# Patient Record
Sex: Female | Born: 2000 | Race: White | Hispanic: No | Marital: Single | Smoking: Never smoker
Health system: Southern US, Community
[De-identification: ages and names within clinical notes are randomized; demographics above are authoritative.]

## PROBLEM LIST (undated history)

## (undated) DIAGNOSIS — R51 Headache: Secondary | ICD-10-CM

## (undated) DIAGNOSIS — Z973 Presence of spectacles and contact lenses: Secondary | ICD-10-CM

## (undated) DIAGNOSIS — Z8489 Family history of other specified conditions: Secondary | ICD-10-CM

## (undated) DIAGNOSIS — R519 Headache, unspecified: Secondary | ICD-10-CM

## (undated) DIAGNOSIS — T8859XA Other complications of anesthesia, initial encounter: Secondary | ICD-10-CM

## (undated) HISTORY — PX: OTHER SURGICAL HISTORY: SHX169

---
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
## 2019-01-04 ENCOUNTER — Encounter: Payer: Self-pay | Admitting: Plastic Surgery

## 2019-01-04 ENCOUNTER — Other Ambulatory Visit: Payer: Self-pay

## 2019-01-04 ENCOUNTER — Ambulatory Visit (INDEPENDENT_AMBULATORY_CARE_PROVIDER_SITE_OTHER): Payer: Self-pay | Admitting: Plastic Surgery

## 2019-01-04 DIAGNOSIS — L910 Hypertrophic scar: Secondary | ICD-10-CM | POA: Insufficient documentation
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
## 2019-02-01 ENCOUNTER — Ambulatory Visit (INDEPENDENT_AMBULATORY_CARE_PROVIDER_SITE_OTHER): Payer: BC Managed Care – PPO | Admitting: Plastic Surgery

## 2019-02-01 ENCOUNTER — Encounter: Payer: Self-pay | Admitting: Plastic Surgery

## 2019-02-01 ENCOUNTER — Other Ambulatory Visit: Payer: Self-pay

## 2019-02-01 VITALS — BP 118/75 | HR 83 | Temp 98.2°F | Ht 65.0 in | Wt 116.0 lb

## 2019-02-01 DIAGNOSIS — L910 Hypertrophic scar: Secondary | ICD-10-CM | POA: Diagnosis not present

## 2019-02-01 NOTE — Progress Notes (Signed)
Procedure Note  Preoperative Dx: hypertrophic scar of right wrist  Postoperative Dx: Same  Procedure: Kenalog injection to right wrist scar 5 cm    Description of Procedure: Risks and complications were explained to the patient.  Consent was confirmed and the patient understands the risks and benefits.  The potential complications and alternatives were explained and the patient consents.  The patient expressed understanding the option of not having the procedure and the risks of a scar.  Time out was called and all information was confirmed to be correct.    The area was prepped with chlorhexidine.  Lidocaine 1% 0.1 cc and Kenalog 50 / 5 mg 0.1 mixture was prepared.  The scar was injected with the mixture.  A dressing was applied.  The patient was given instructions on how to care for the area and a follow up appointment.  Linda Monroe tolerated the procedure well and there were no complications.

## 2019-03-11 ENCOUNTER — Ambulatory Visit (INDEPENDENT_AMBULATORY_CARE_PROVIDER_SITE_OTHER): Payer: BC Managed Care – PPO | Admitting: Plastic Surgery

## 2019-03-11 ENCOUNTER — Other Ambulatory Visit: Payer: Self-pay

## 2019-03-11 ENCOUNTER — Encounter: Payer: Self-pay | Admitting: Plastic Surgery

## 2019-03-11 VITALS — BP 111/72 | HR 82 | Temp 97.8°F | Ht 65.0 in | Wt 120.0 lb

## 2019-03-11 DIAGNOSIS — L910 Hypertrophic scar: Secondary | ICD-10-CM
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
## 2019-03-15 ENCOUNTER — Ambulatory Visit: Payer: BC Managed Care – PPO | Admitting: Plastic Surgery

## 2019-05-24 ENCOUNTER — Ambulatory Visit: Payer: BC Managed Care – PPO | Admitting: Plastic Surgery

## 2019-06-28 ENCOUNTER — Ambulatory Visit (INDEPENDENT_AMBULATORY_CARE_PROVIDER_SITE_OTHER): Payer: BC Managed Care – PPO | Admitting: Plastic Surgery

## 2019-06-28 ENCOUNTER — Encounter: Payer: Self-pay | Admitting: Plastic Surgery

## 2019-06-28 ENCOUNTER — Other Ambulatory Visit: Payer: Self-pay

## 2019-06-28 VITALS — BP 110/67 | HR 80 | Temp 97.8°F | Ht 65.0 in | Wt 119.2 lb

## 2019-06-28 DIAGNOSIS — L910 Hypertrophic scar: Secondary | ICD-10-CM | POA: Diagnosis not present

## 2019-06-28 DIAGNOSIS — Z719 Counseling, unspecified: Secondary | ICD-10-CM

## 2019-06-28 NOTE — Progress Notes (Signed)
Procedure Note  Preoperative Dx: Hypertrophic scar right wrist  Postoperative Dx: Same  Procedure: Kenalog injection hypertrophic scar right wrist 5 cm  Description of Procedure: Risks and complications were explained to the patient and mom.  Consent was confirmed and the patient understands the risks and benefits.  The potential complications and alternatives were explained and the patient consents.  The patient expressed understanding the option of not having the procedure and the risks of a scar.  Time out was called and all information was confirmed to be correct.    The area was prepped.  Lidocaine 1% with epinepherine 0.2 cc and kenalog 10 mg/1 ml 0.2 cc was injected into the hypertrophic scar of the right wrist.   A dressing was applied.  The patient was given instructions on how to care for the area and a follow up appointment.  Linda Monroe tolerated the procedure well and there were no complications.  I also recommended skinUva.

## 2019-09-30 ENCOUNTER — Other Ambulatory Visit: Payer: Self-pay

## 2019-09-30 ENCOUNTER — Ambulatory Visit (INDEPENDENT_AMBULATORY_CARE_PROVIDER_SITE_OTHER): Payer: BC Managed Care – PPO | Admitting: Plastic Surgery

## 2019-09-30 ENCOUNTER — Encounter: Payer: Self-pay | Admitting: Plastic Surgery

## 2019-09-30 VITALS — BP 121/75 | HR 86 | Temp 98.5°F

## 2019-09-30 DIAGNOSIS — L910 Hypertrophic scar: Secondary | ICD-10-CM | POA: Diagnosis not present

## 2019-09-30 NOTE — Progress Notes (Signed)
   Subjective:    Patient ID: Linda Monroe, female    DOB: 01/21/2001, 19 y.o.   MRN: [id number]  The patient is a 19 year old female here with mom for evaluation of her right wrist hypertrophic/keloid scar.  Based on what we see today it looks like it may be a little bit better.  She has been using the skinuva cream and doing a little bit of massage.  The Kenalog injection was in May.   Review of Systems  Constitutional: Negative.   Eyes: Negative.   Respiratory: Negative.   Cardiovascular: Negative.   Genitourinary: Negative.   Musculoskeletal: Negative.        Objective:   Physical Exam Vitals and nursing note reviewed.  Constitutional:      Appearance: Normal appearance.  HENT:     Head: Normocephalic and atraumatic.  Cardiovascular:     Rate and Rhythm: Normal rate.     Pulses: Normal pulses.  Neurological:     General: No focal deficit present.     Mental Status: She is alert and oriented to person, place, and time.  Psychiatric:        Mood and Affect: Mood normal.        Behavior: Behavior normal.        Thought Content: Thought content normal.        Assessment & Plan:     ICD-10-CM   1. Hypertrophic scar of skin  L91.0   We agree that giving it more time would be wise.  We do not want atrophy of the area.  The injection or even the skin [place] Artist seems to be helping.  We will give it 2-3 more months and see her back.  In the meantime recommend continuing with the skinuva and massage.  Pictures were obtained of the patient and placed in the chart with the patient's or guardian's permission.

## 2019-12-15 ENCOUNTER — Encounter: Payer: Self-pay | Admitting: Nurse Practitioner

## 2019-12-15 ENCOUNTER — Other Ambulatory Visit: Payer: Self-pay

## 2019-12-15 ENCOUNTER — Ambulatory Visit (INDEPENDENT_AMBULATORY_CARE_PROVIDER_SITE_OTHER): Payer: Managed Care, Other (non HMO) | Admitting: Nurse Practitioner

## 2019-12-15 VITALS — BP 118/78 | Ht 65.0 in | Wt 118.0 lb

## 2019-12-15 DIAGNOSIS — Z01419 Encounter for gynecological examination (general) (routine) without abnormal findings: Secondary | ICD-10-CM

## 2019-12-15 DIAGNOSIS — Z113 Encounter for screening for infections with a predominantly sexual mode of transmission: Secondary | ICD-10-CM | POA: Diagnosis not present

## 2019-12-15 DIAGNOSIS — Z3009 Encounter for other general counseling and advice on contraception: Secondary | ICD-10-CM | POA: Diagnosis not present

## 2019-12-15 NOTE — Patient Instructions (Signed)
Health Maintenance, Female Adopting a healthy lifestyle and getting preventive care are important in promoting health and wellness. Ask your health care provider about:  The right schedule for you to have regular tests and exams.  Things you can do on your own to prevent diseases and keep yourself healthy. What should I know about diet, weight, and exercise? Eat a healthy diet   Eat a diet that includes plenty of vegetables, fruits, low-fat dairy products, and lean protein.  Do not eat a lot of foods that are high in solid fats, added sugars, or sodium. Maintain a healthy weight Body mass index (BMI) is used to identify weight problems. It estimates body fat based on height and weight. Your health care provider can help determine your BMI and help you achieve or maintain a healthy weight. Get regular exercise Get regular exercise. This is one of the most important things you can do for your health. Most adults should:  Exercise for at least 150 minutes each week. The exercise should increase your heart rate and make you sweat (moderate-intensity exercise).  Do strengthening exercises at least twice a week. This is in addition to the moderate-intensity exercise.  Spend less time sitting. Even light physical activity can be beneficial. Watch cholesterol and blood lipids Have your blood tested for lipids and cholesterol at 20 years of age, then have this test every 5 years. Have your cholesterol levels checked more often if:  Your lipid or cholesterol levels are high.  You are older than 20 years of age.  You are at high risk for heart disease. What should I know about cancer screening? Depending on your health history and family history, you may need to have cancer screening at various ages. This may include screening for:  Breast cancer.  Cervical cancer.  Colorectal cancer.  Skin cancer.  Lung cancer. What should I know about heart disease, diabetes, and high blood  pressure? Blood pressure and heart disease  High blood pressure causes heart disease and increases the risk of stroke. This is more likely to develop in people who have high blood pressure readings, are of African descent, or are overweight.  Have your blood pressure checked: ? Every 3-5 years if you are 18-39 years of age. ? Every year if you are 40 years old or older. Diabetes Have regular diabetes screenings. This checks your fasting blood sugar level. Have the screening done:  Once every three years after age 40 if you are at a normal weight and have a low risk for diabetes.  More often and at a younger age if you are overweight or have a high risk for diabetes. What should I know about preventing infection? Hepatitis B If you have a higher risk for hepatitis B, you should be screened for this virus. Talk with your health care provider to find out if you are at risk for hepatitis B infection. Hepatitis C Testing is recommended for:  Everyone born from 1945 through 1965.  Anyone with known risk factors for hepatitis C. Sexually transmitted infections (STIs)  Get screened for STIs, including gonorrhea and chlamydia, if: ? You are sexually active and are younger than 20 years of age. ? You are older than 20 years of age and your health care provider tells you that you are at risk for this type of infection. ? Your sexual activity has changed since you were last screened, and you are at increased risk for chlamydia or gonorrhea. Ask your health care provider if   you are at risk.  Ask your health care provider about whether you are at high risk for HIV. Your health care provider may recommend a prescription medicine to help prevent HIV infection. If you choose to take medicine to prevent HIV, you should first get tested for HIV. You should then be tested every 3 months for as long as you are taking the medicine. Pregnancy  If you are about to stop having your period (premenopausal) and  you may become pregnant, seek counseling before you get pregnant.  Take 400 to 800 micrograms (mcg) of folic acid every day if you become pregnant.  Ask for birth control (contraception) if you want to prevent pregnancy. Osteoporosis and menopause Osteoporosis is a disease in which the bones lose minerals and strength with aging. This can result in bone fractures. If you are 65 years old or older, or if you are at risk for osteoporosis and fractures, ask your health care provider if you should:  Be screened for bone loss.  Take a calcium or vitamin D supplement to lower your risk of fractures.  Be given hormone replacement therapy (HRT) to treat symptoms of menopause. Follow these instructions at home: Lifestyle  Do not use any products that contain nicotine or tobacco, such as cigarettes, e-cigarettes, and chewing tobacco. If you need help quitting, ask your health care provider.  Do not use street drugs.  Do not share needles.  Ask your health care provider for help if you need support or information about quitting drugs. Alcohol use  Do not drink alcohol if: ? Your health care provider tells you not to drink. ? You are pregnant, may be pregnant, or are planning to become pregnant.  If you drink alcohol: ? Limit how much you use to 0-1 drink a day. ? Limit intake if you are breastfeeding.  Be aware of how much alcohol is in your drink. In the [place] one drink equals one 12 oz bottle of beer (355 mL), one 5 oz glass of wine (148 mL), or one 1 oz glass of hard liquor (44 mL). General instructions  Schedule regular health, dental, and eye exams.  Stay current with your vaccines.  Tell your health care provider if: ? You often feel depressed. ? You have ever been abused or do not feel safe at home. Summary  Adopting a healthy lifestyle and getting preventive care are important in promoting health and wellness.  Follow your health care provider's instructions about healthy  diet, exercising, and getting tested or screened for diseases.  Follow your health care provider's instructions on monitoring your cholesterol and blood pressure. This information is not intended to replace advice given to you by your health care provider. Make sure you discuss any questions you have with your health care provider. Document Revised: 02/03/2018 Document Reviewed: 02/03/2018 Elsevier Patient Education  2020 [employer]  

## 2019-12-15 NOTE — Progress Notes (Signed)
   DEJAH DROESSLER 2000-08-07 [id number]   History:  19 y.o. G0 presents to establish care without GYN complaints. Regular monthly cycle. Condoms for contraception. Sexually active, multiple partners. Gardasil series completed.   Gynecologic History Patient's last menstrual period was 12/08/2019. Period Cycle (Days): 28 Period Duration (Days): 5 Period Pattern: Regular Menstrual Flow: Moderate Menstrual Control: Maxi pad, Tampon Dysmenorrhea: (!) Mild Dysmenorrhea Symptoms: Cramping Contraception: condoms   Past medical history, past surgical history, family history and social history were all reviewed and documented in the EPIC chart.  ROS:  A ROS was performed and pertinent positives and negatives are included.  Exam:  Vitals:   12/15/19 1157  BP: 118/78  Weight: 118 lb (53.5 kg)  Height: 5\' 5"  (1.651 m)   Body mass index is 19.64 kg/m.  General appearance:  Normal Thyroid:  Symmetrical, normal in size, without palpable masses or nodularity. Respiratory  Auscultation:  Clear without wheezing or rhonchi Cardiovascular  Auscultation:  Regular rate, without rubs, murmurs or gallops  Edema/varicosities:  Not grossly evident Abdominal  Soft,nontender, without masses, guarding or rebound.  Liver/spleen:  No organomegaly noted  Hernia:  None appreciated  Skin  Inspection:  Grossly normal   Breasts: Examined lying and sitting.   Right: Without masses, retractions, discharge or axillary adenopathy.   Left: Without masses, retractions, discharge or axillary adenopathy. Gentitourinary   Inguinal/mons:  Normal without inguinal adenopathy  External genitalia:  Normal  BUS/Urethra/Skene's glands:  Normal  Vagina:  Moderate discharge, no erythema  Cervix:  Normal  Uterus:  Normal in size, shape and contour.  Midline and mobile  Adnexa/parametria:     Rt: Without masses or tenderness.   Lt: Without masses or tenderness.  Anus and perineum: Normal  Assessment/Plan:  19  y.o. G0 to establish care.   Well female exam with routine gynecological exam - Education provided on SBEs, importance of preventative screenings, current guidelines, high calcium diet, regular exercise, and multivitamin daily.   Screen for STD (sexually transmitted disease) - Plan: C. trachomatis/N. gonorrhoeae RNA, HIV Antibody (routine testing w rflx), RPR.   Encounter for other general counseling and advice on contraception - Discussed contraception options to include pill, patch, vaginal ring, injectable, implant, and IUD. She is not interested at this time. Recommend condom use consistently until permanent partner. She does complain of headaches during cycle so I informed her that hormonal contraception may help with this.  Follow up in 1 year for annual.        [facility], 12:06 PM 12/15/2019

## 2019-12-16 ENCOUNTER — Other Ambulatory Visit: Payer: Self-pay

## 2019-12-16 ENCOUNTER — Encounter: Payer: Self-pay | Admitting: Plastic Surgery

## 2019-12-16 ENCOUNTER — Ambulatory Visit (INDEPENDENT_AMBULATORY_CARE_PROVIDER_SITE_OTHER): Payer: Managed Care, Other (non HMO) | Admitting: Plastic Surgery

## 2019-12-16 VITALS — BP 115/73 | HR 77 | Temp 98.4°F

## 2019-12-16 DIAGNOSIS — L910 Hypertrophic scar: Secondary | ICD-10-CM

## 2019-12-16 LAB — HIV ANTIBODY (ROUTINE TESTING W REFLEX): HIV 1&2 Ab, 4th Generation: NONREACTIVE

## 2019-12-16 LAB — C. TRACHOMATIS/N. GONORRHOEAE RNA
C. trachomatis RNA, TMA: NOT DETECTED
N. gonorrhoeae RNA, TMA: NOT DETECTED

## 2019-12-16 LAB — RPR: RPR Ser Ql: NONREACTIVE

## 2019-12-16 NOTE — Progress Notes (Signed)
Patient is a 19 year old female here for follow-up on her right breast hypertrophic scar/keloid.  She was using the skin Uva for about a month but not twice a day.  We talked about the options for another steroid injection, excision versus more conservative treatment.  What we decided was to be really good about using the skin Uva twice a day for the next 2 months.  Use the silicone patches as much as possible and massage.  She can come and see me before she does her semester abroad in [place].  If needed we can do a steroid injection at that time.  Were just trying to prevent thinning of the skin.

## 2020-01-25 IMAGING — RF DG WRIST COMPLETE 3+V*R*
1 series · 5 of 5 positions shown · non-contrast
Comparison: May 04, 2017

FLUOROSCOPY TIME:  0 minutes 16 seconds; 5 acquired images

CLINICAL DATA: Open reduction internal fixation for fracture

EXAM:
RIGHT WRIST - COMPLETE 3+ VIEW

[Series 1: run · 5 of 5 slices shown]
[im 1/5]
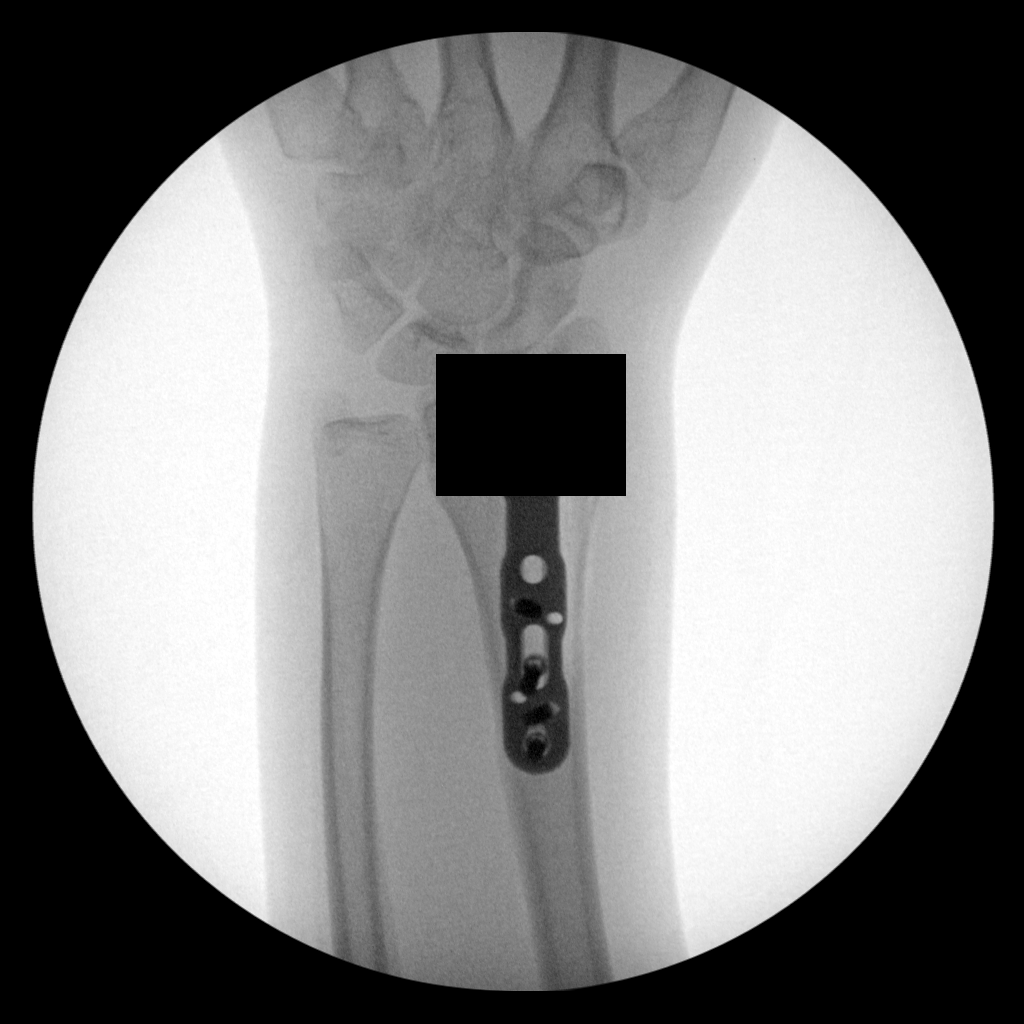
[im 2/5]
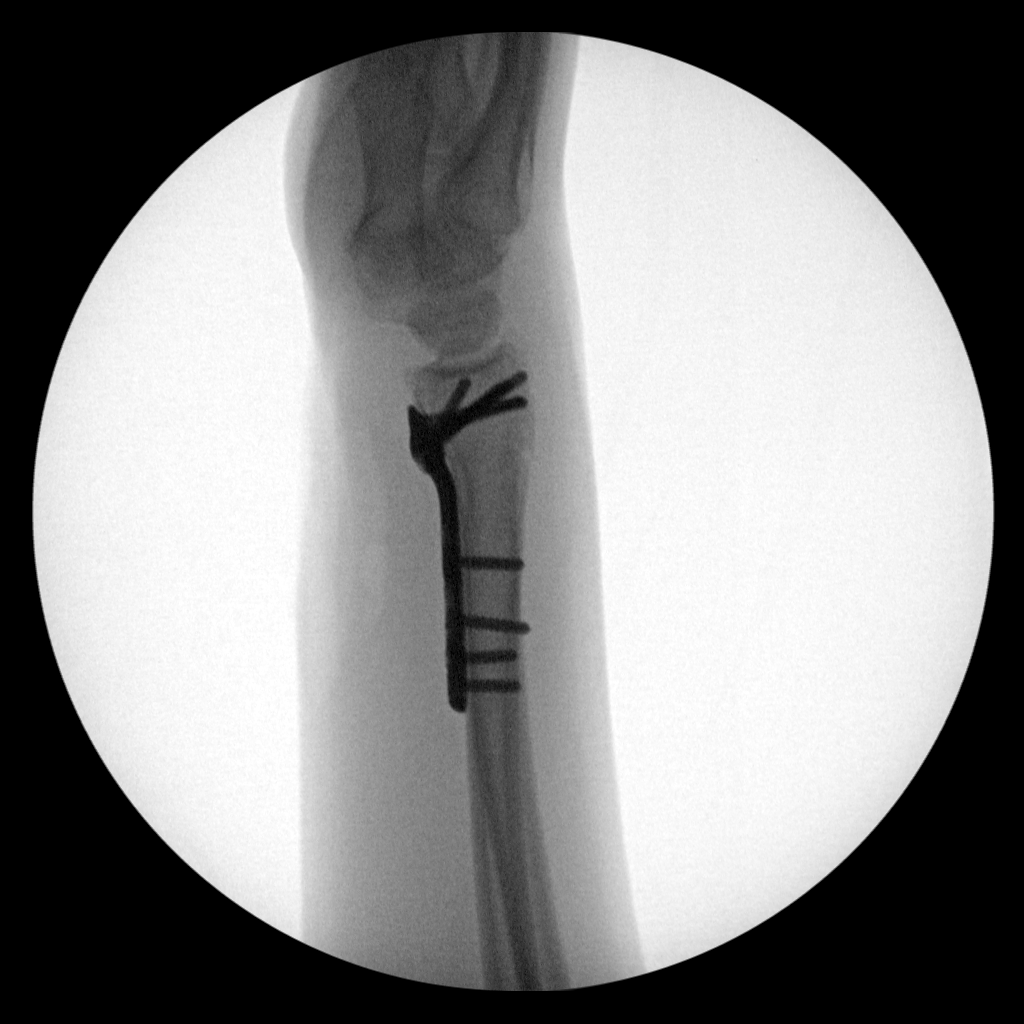
[im 3/5]
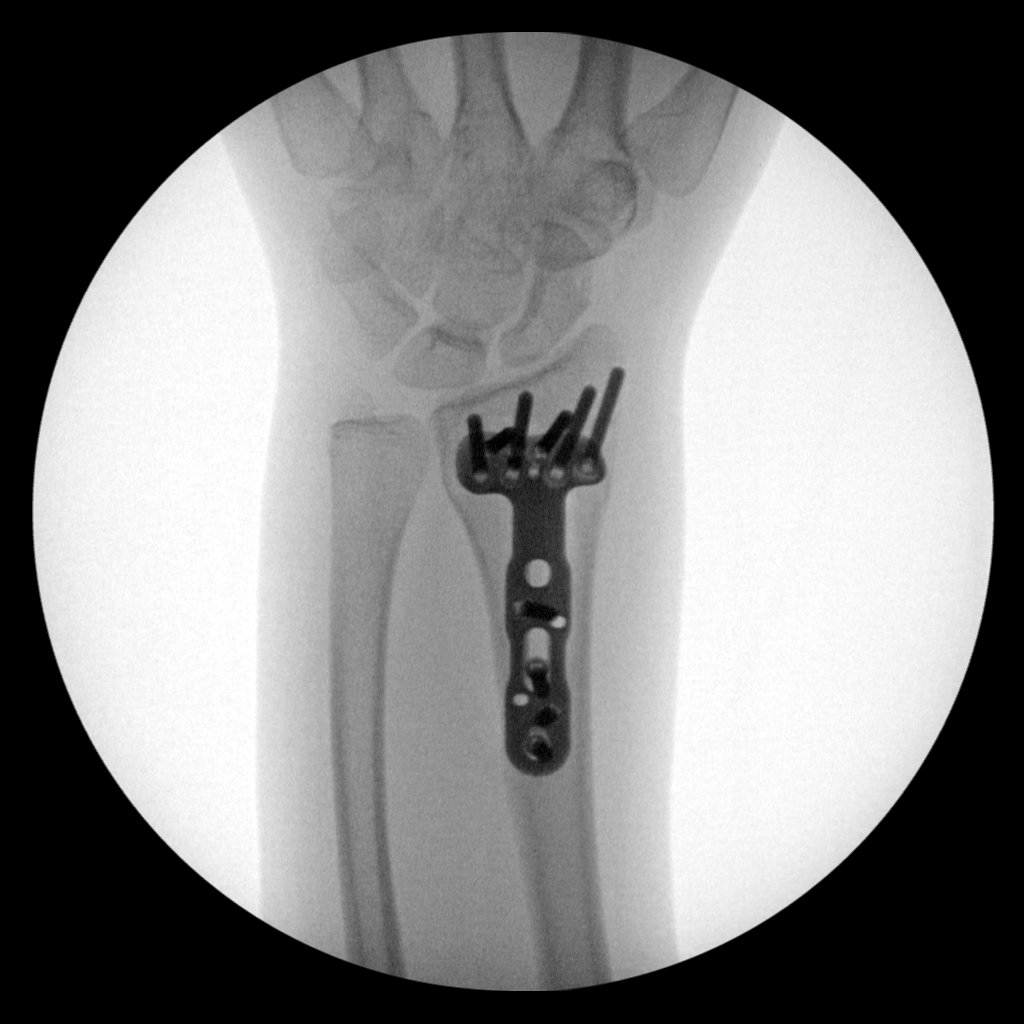
[im 4/5]
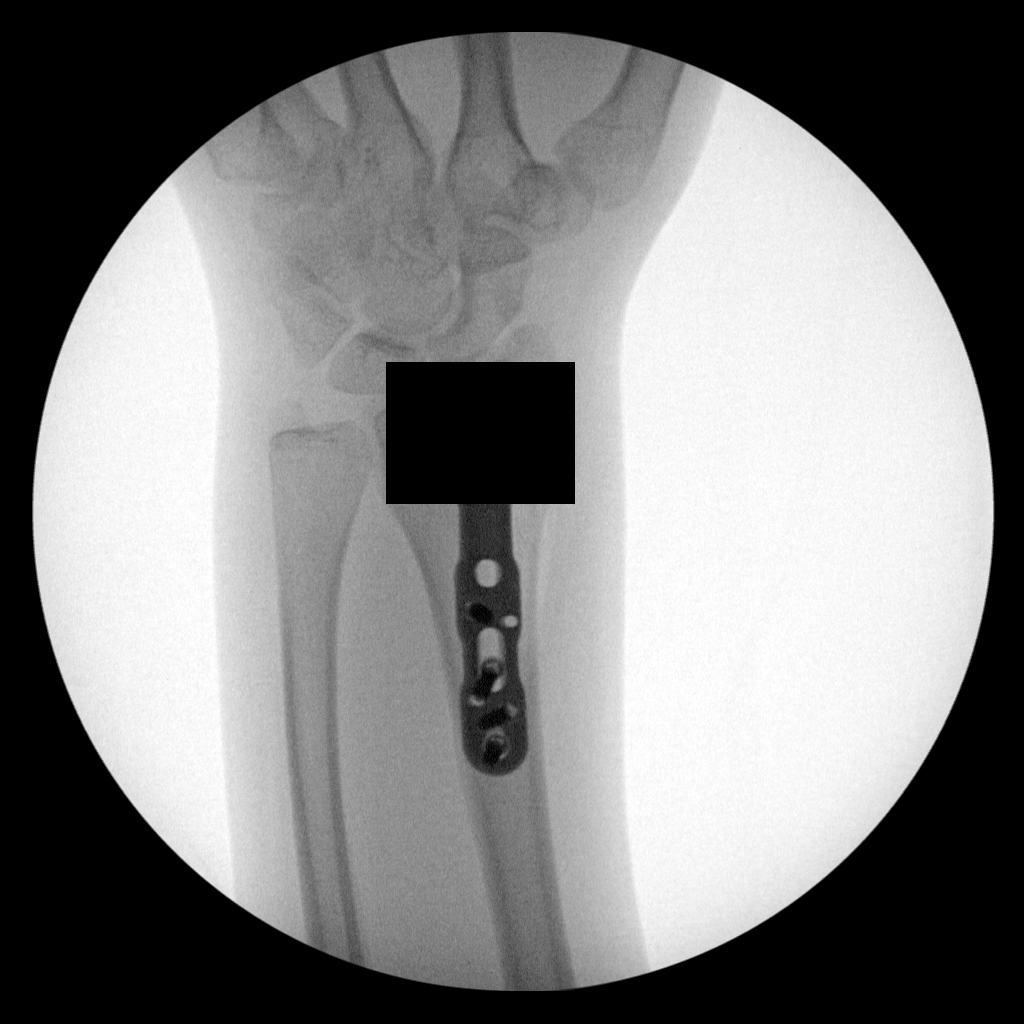
[im 5/5]
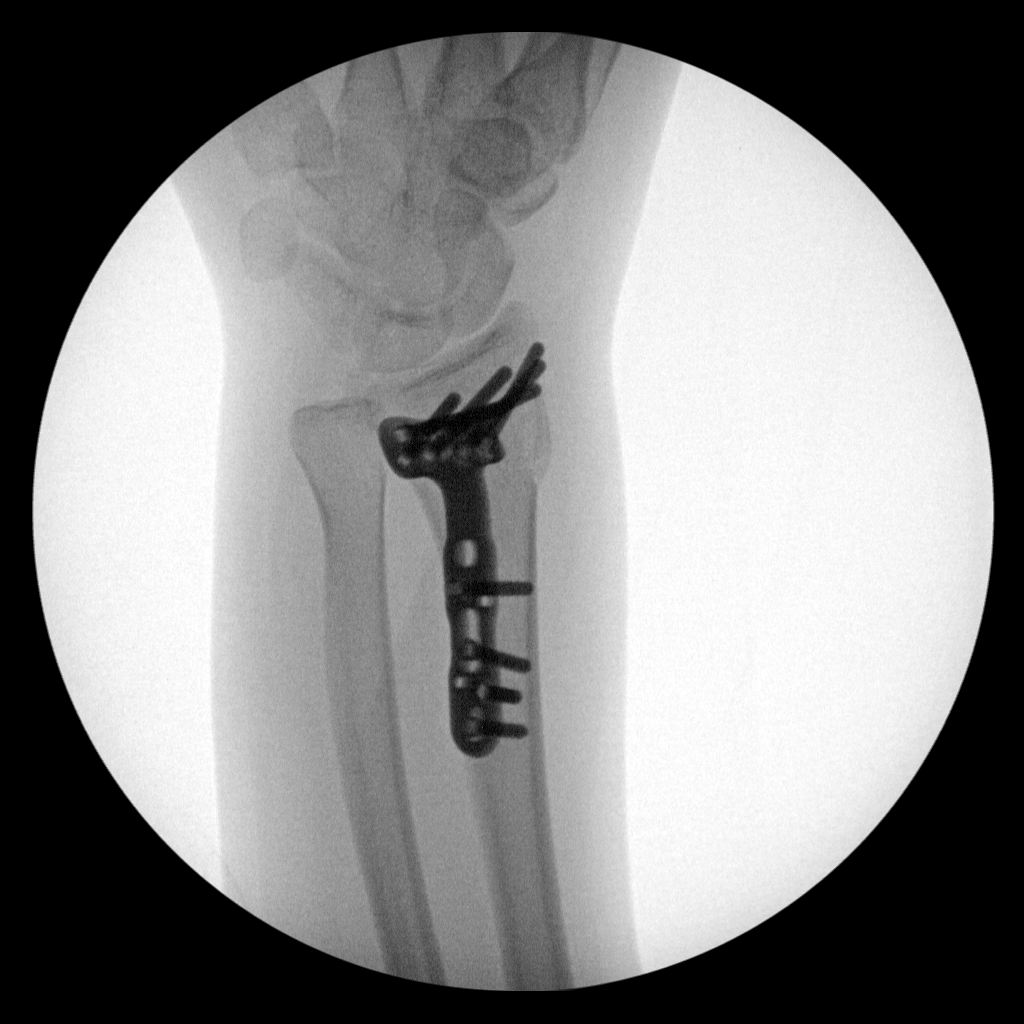

[5 of 5 positions shown; findings below may reference images not displayed]

FINDINGS: Frontal, oblique, and lateral views obtained. There is screw and
plate fixation through a fracture of the distal radial metaphysis
with alignment essentially anatomic. No new fracture. No
dislocation. Joint spaces appear normal. No erosive change.
IMPRESSION: Open reduction internal fixation for distal radial fracture with
alignment anatomic following screw and plate fixation. No new
fracture. No dislocation. No evident arthropathic change.
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
## 2020-01-25 IMAGING — RF DG C-ARM 61-120 MIN
1 series · 10 of 10 positions shown · non-contrast
Comparison: Radiographs May 04, 2017.

CLINICAL DATA: Left sacroiliac screw placement for fracture.

EXAM:
BILATERAL SACROILIAC JOINTS - 3+ VIEW; DG C-ARM 61-120 MIN
FLUOROSCOPY TIME:  40 seconds.

[Series 1: run · 10 of 10 slices shown]
[im 1/10]
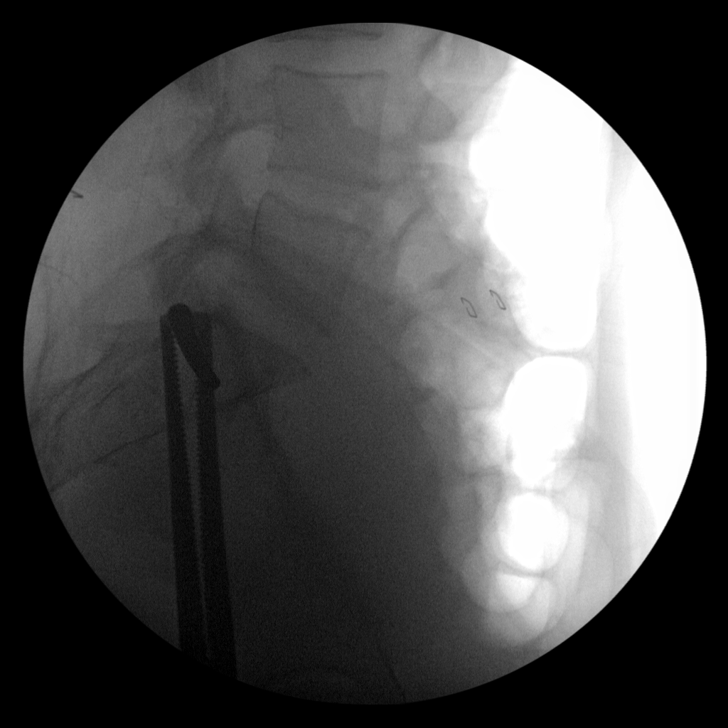
[im 2/10]
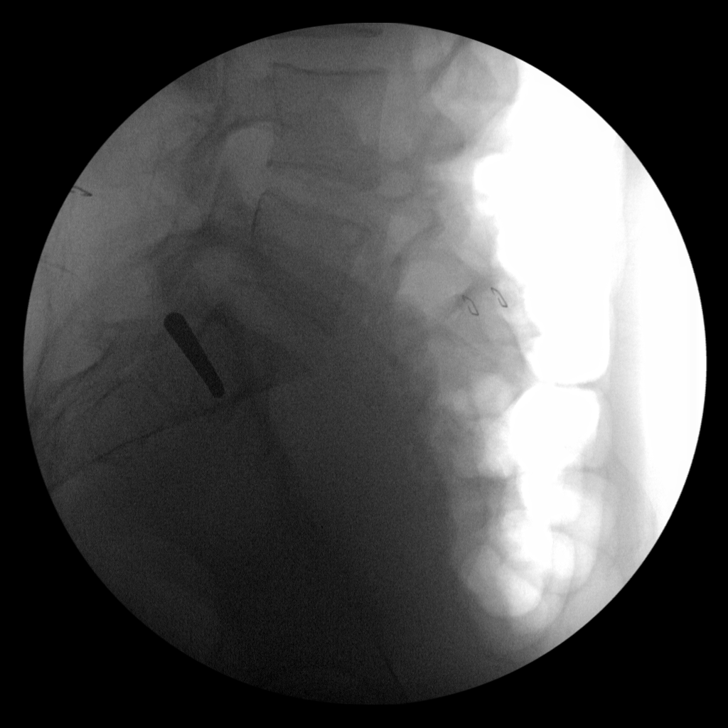
[im 3/10]
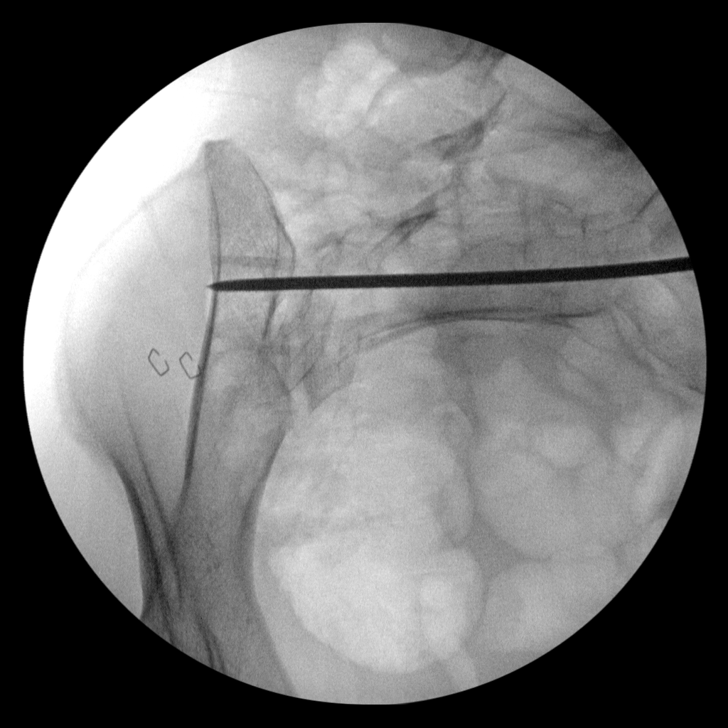
[im 4/10]
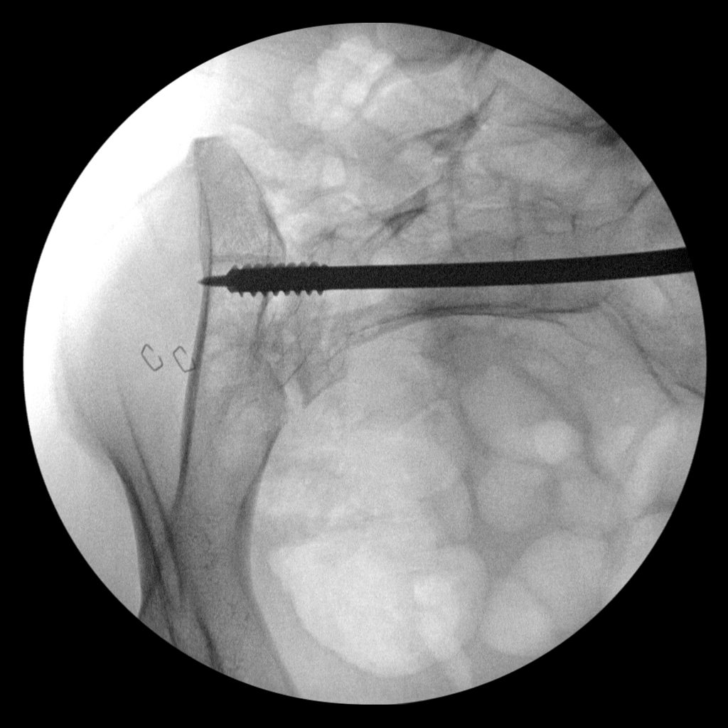
[im 5/10]
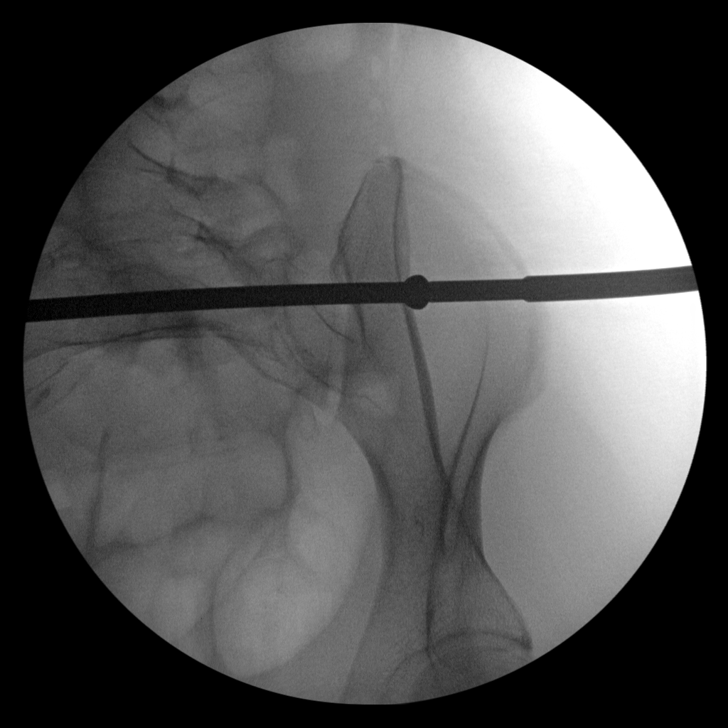
[im 6/10]
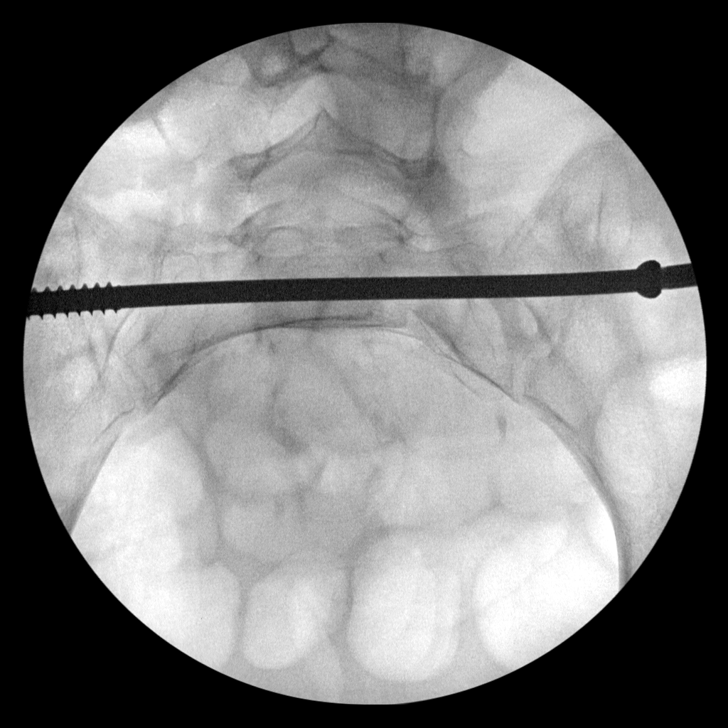
[im 7/10]
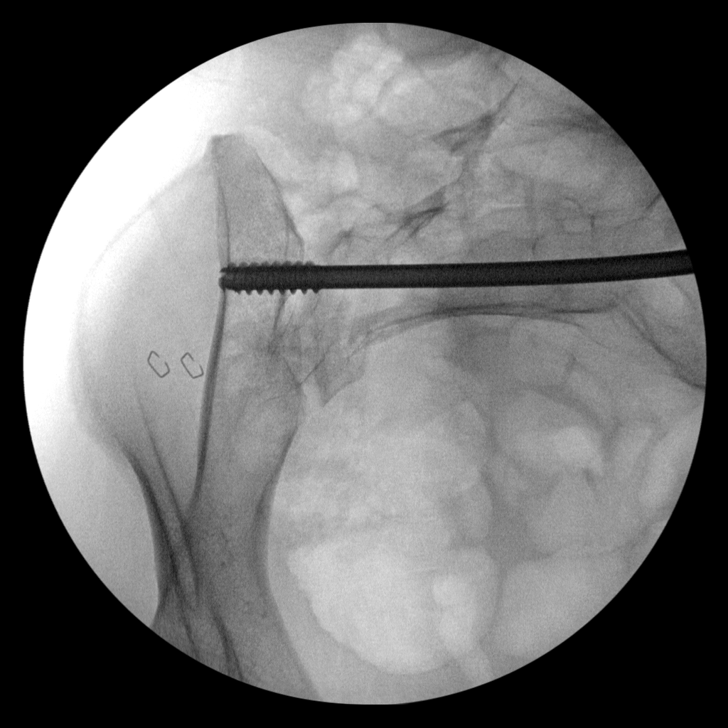
[im 8/10]
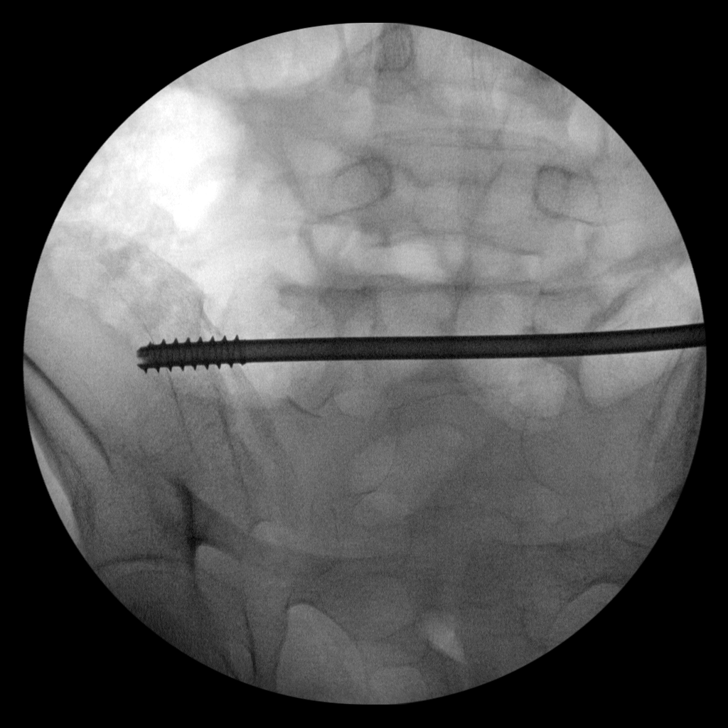
[im 9/10]
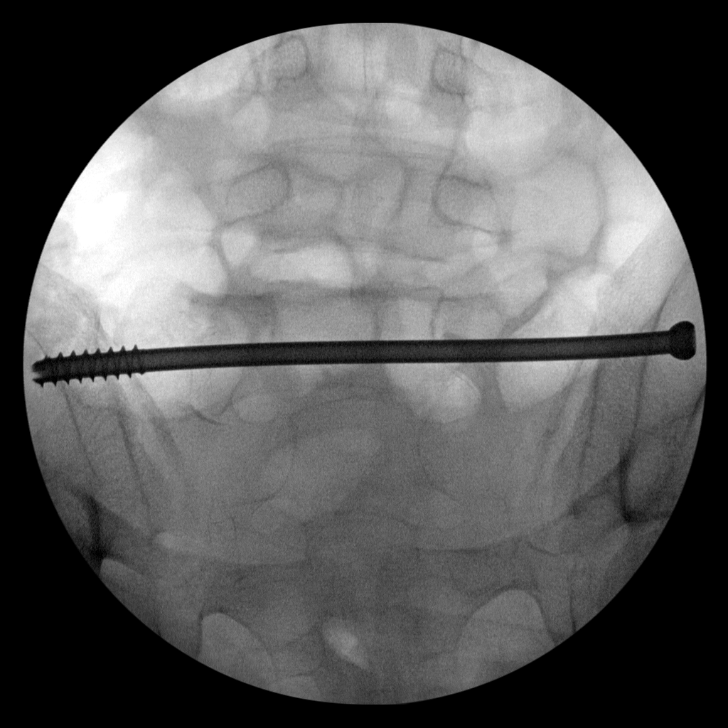
[im 10/10]
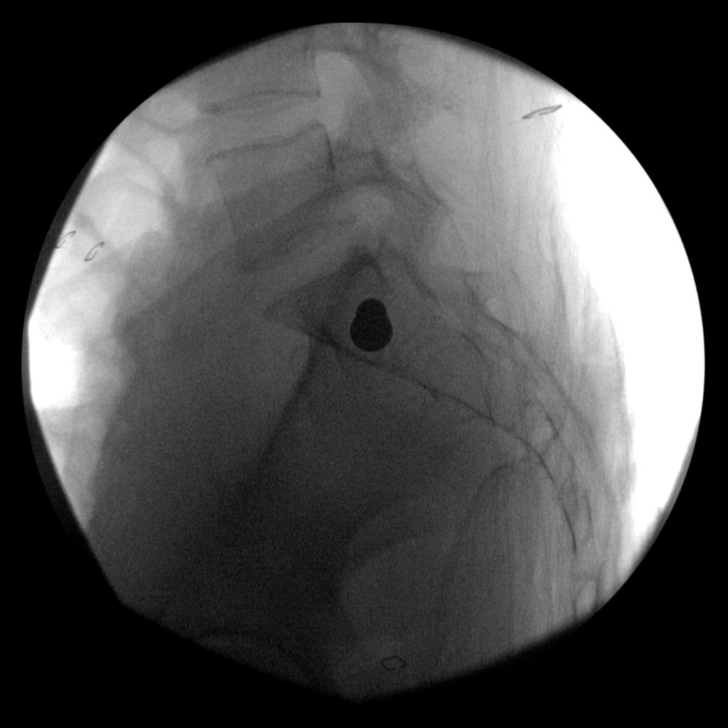

[10 of 10 positions shown; findings below may reference images not displayed]

FINDINGS: Ten fluoroscopic images of the pelvis were submitted for review.
These images demonstrate placement of surgical screw through both
sacroiliac joints and the sacrum, from left to right.
IMPRESSION: Status post fixation screw placement through both sacroiliac joints.

## 2020-02-28 ENCOUNTER — Encounter: Payer: Self-pay | Admitting: Plastic Surgery

## 2020-02-28 ENCOUNTER — Ambulatory Visit (INDEPENDENT_AMBULATORY_CARE_PROVIDER_SITE_OTHER): Payer: Managed Care, Other (non HMO) | Admitting: Plastic Surgery

## 2020-02-28 ENCOUNTER — Other Ambulatory Visit: Payer: Self-pay

## 2020-02-28 VITALS — BP 118/72 | HR 92 | Temp 98.6°F

## 2020-02-28 DIAGNOSIS — L910 Hypertrophic scar: Secondary | ICD-10-CM

## 2020-02-28 NOTE — Progress Notes (Signed)
Procedure Note  Preoperative Dx: right wrist hypertrophic scar  Postoperative Dx: Same  Procedure: right wrist hypertrophic scar  Anesthesia: Lidocaine 1% with 1:100,000 epinepherine   Description of Procedure: Risks and complications were explained to the patient.  Consent was confirmed and the patient understands the risks and benefits.  The potential complications and alternatives were explained and the patient consents.  The patient expressed understanding the option of not having the procedure and the risks of a scar.  Time out was called and all information was confirmed to be correct.    The area was prepped.  Lidocaine 1% with epinepherine 0.2 cc and Kenalog 50 mg/5 mg 0.2 cc was injected into the distal portion of the hypertrophic scar of the right wrist. A dressing was applied.  The patient was given instructions on how to care for the area and a follow up appointment.  Linda Monroe tolerated the procedure well and there were no complications.

## 2022-07-14 DIAGNOSIS — L700 Acne vulgaris: Secondary | ICD-10-CM | POA: Diagnosis not present

## 2022-09-10 DIAGNOSIS — S0012XS Contusion of left eyelid and periocular area, sequela: Secondary | ICD-10-CM | POA: Diagnosis not present

## 2022-09-10 DIAGNOSIS — H40012 Open angle with borderline findings, low risk, left eye: Secondary | ICD-10-CM | POA: Diagnosis not present

## 2022-12-24 ENCOUNTER — Other Ambulatory Visit (HOSPITAL_COMMUNITY)
Admission: RE | Admit: 2022-12-24 | Discharge: 2022-12-24 | Disposition: A | Discharge status: Home / Self Care | Payer: Self-pay | Source: Ambulatory Visit | Attending: Nurse Practitioner | Admitting: Nurse Practitioner

## 2022-12-24 ENCOUNTER — Ambulatory Visit (INDEPENDENT_AMBULATORY_CARE_PROVIDER_SITE_OTHER): Payer: 59 | Admitting: Nurse Practitioner

## 2022-12-24 ENCOUNTER — Encounter: Payer: Self-pay | Admitting: Nurse Practitioner

## 2022-12-24 VITALS — BP 102/72 | HR 73 | Ht 65.25 in | Wt 129.0 lb

## 2022-12-24 DIAGNOSIS — Z124 Encounter for screening for malignant neoplasm of cervix: Secondary | ICD-10-CM

## 2022-12-24 DIAGNOSIS — Z113 Encounter for screening for infections with a predominantly sexual mode of transmission: Secondary | ICD-10-CM | POA: Insufficient documentation

## 2022-12-24 DIAGNOSIS — Z01419 Encounter for gynecological examination (general) (routine) without abnormal findings: Secondary | ICD-10-CM | POA: Diagnosis not present

## 2022-12-24 DIAGNOSIS — Z79899 Other long term (current) drug therapy: Secondary | ICD-10-CM | POA: Diagnosis not present

## 2022-12-24 DIAGNOSIS — L700 Acne vulgaris: Secondary | ICD-10-CM | POA: Diagnosis not present

## 2022-12-24 NOTE — Progress Notes (Signed)
   Linda Monroe Oct 02, 2000 [id number]   History:  22 y.o. G0 presents for annual exam. Last seen in 2021. Monthly cycles. Gardasil series completed. Requesting labs and STD screening today.   Gynecologic History Patient's last menstrual period was 12/18/2022 (exact date). Period Cycle (Days):  (28-30) Period Duration (Days): 6-7 Menstrual Flow: Moderate Menstrual Control: Panty liner, Maxi pad Dysmenorrhea: (!) Moderate Dysmenorrhea Symptoms: Cramping, Headache (In general but more intense around cycle.) Contraception: condoms Sexually active: Yes  Health Maintenance Last Pap: Never Last mammogram: Not indicated Last colonoscopy: Not indicated Last Dexa: Not indicated  Past medical history, past surgical history, family history and social history were all reviewed and documented in the EPIC chart. Bachelors in sociology. Doing internships. Planning on grad school.   ROS:  A ROS was performed and pertinent positives and negatives are included.  Exam:  Vitals:   12/24/22 1037  BP: 102/72  Pulse: 73  SpO2: 99%  Weight: 129 lb (58.5 kg)  Height: 5' 5.25" (1.657 m)    Body mass index is 21.3 kg/m.  General appearance:  Normal Thyroid:  Symmetrical, normal in size, without palpable masses or nodularity. Respiratory  Auscultation:  Clear without wheezing or rhonchi Cardiovascular  Auscultation:  Regular rate, without rubs, murmurs or gallops  Edema/varicosities:  Not grossly evident Abdominal  Soft,nontender, without masses, guarding or rebound.  Liver/spleen:  No organomegaly noted  Hernia:  None appreciated  Skin  Inspection:  Grossly normal   Breasts: Not indicated per guidelines Pelvic: External genitalia:  no lesions              Urethra:  normal appearing urethra with no masses, tenderness or lesions              Bartholins and Skenes: normal                 Vagina: normal appearing vagina with normal color and discharge, no lesions              Cervix: no  lesions Bimanual Exam:  Uterus:  no masses or tenderness              Adnexa: no mass, fullness, tenderness              Rectovaginal: Deferred              Anus:  normal, no lesions  Patient informed chaperone available to be present for breast and pelvic exam. Patient has requested no chaperone to be present. Patient has been advised what will be completed during breast and pelvic exam.   Assessment/Plan:  22 y.o. G0 for annual exam.   Well female exam with routine gynecological exam - Plan: CBC with Differential/Platelet, Comprehensive metabolic panel, Lipid panel. Education provided on SBEs, importance of preventative screenings, current guidelines, high calcium diet, regular exercise, and multivitamin daily.   Cervical cancer screening - Plan: Cytology - PAP( [facility]). Initial pap today.   Screening examination for STD (sexually transmitted disease) - Plan: Cytology - PAP( [facility]), RPR, HIV Antibody (routine testing w rflx). GC/CT/trich added to pap.  Return in about 1 year (around 12/24/2023) for Annual.       Olivia Mackie [facility], 10:53 AM 12/24/2022

## 2022-12-25 LAB — COMPREHENSIVE METABOLIC PANEL
AG Ratio: 1.9 (calc) (ref 1.0–2.5)
ALT: 15 U/L (ref 6–29)
AST: 15 U/L (ref 10–30)
Albumin: 4.6 g/dL (ref 3.6–5.1)
Alkaline phosphatase (APISO): 53 U/L (ref 31–125)
BUN: 10 mg/dL (ref 7–25)
CO2: 24 mmol/L (ref 20–32)
Calcium: 9.7 mg/dL (ref 8.6–10.2)
Chloride: 104 mmol/L (ref 98–110)
Creat: 0.86 mg/dL (ref 0.50–0.96)
Globulin: 2.4 g/dL (ref 1.9–3.7)
Glucose, Bld: 85 mg/dL (ref 65–99)
Potassium: 4.1 mmol/L (ref 3.5–5.3)
Sodium: 139 mmol/L (ref 135–146)
Total Bilirubin: 0.6 mg/dL (ref 0.2–1.2)
Total Protein: 7 g/dL (ref 6.1–8.1)

## 2022-12-25 LAB — CBC WITH DIFFERENTIAL/PLATELET
Absolute Lymphocytes: 1764 {cells}/uL (ref 850–3900)
Absolute Monocytes: 369 {cells}/uL (ref 200–950)
Basophils Absolute: 32 {cells}/uL (ref 0–200)
Basophils Relative: 0.7 %
Eosinophils Absolute: 81 {cells}/uL (ref 15–500)
Eosinophils Relative: 1.8 %
HCT: 39.1 % (ref 35.0–45.0)
Hemoglobin: 13.1 g/dL (ref 11.7–15.5)
MCH: 30.6 pg (ref 27.0–33.0)
MCHC: 33.5 g/dL (ref 32.0–36.0)
MCV: 91.4 fL (ref 80.0–100.0)
MPV: 10.1 fL (ref 7.5–12.5)
Monocytes Relative: 8.2 %
Neutro Abs: 2255 {cells}/uL (ref 1500–7800)
Neutrophils Relative %: 50.1 %
Platelets: 373 10*3/uL (ref 140–400)
RBC: 4.28 10*6/uL (ref 3.80–5.10)
RDW: 12.6 % (ref 11.0–15.0)
Total Lymphocyte: 39.2 %
WBC: 4.5 10*3/uL (ref 3.8–10.8)

## 2022-12-25 LAB — LIPID PANEL
Cholesterol: 140 mg/dL (ref <200)
HDL: 38 mg/dL — ABNORMAL LOW (ref >=50)
LDL Cholesterol (Calc): 80 mg/dL
Non-HDL Cholesterol (Calc): 102 mg/dL (ref <130)
Total CHOL/HDL Ratio: 3.7 (calc) (ref <5.0)
Triglycerides: 123 mg/dL (ref <150)

## 2022-12-25 LAB — CYTOLOGY - PAP
Adequacy: ABSENT
Chlamydia: NEGATIVE
Comment: NEGATIVE
Comment: NEGATIVE
Comment: NORMAL
Diagnosis: NEGATIVE
Neisseria Gonorrhea: NEGATIVE
Trichomonas: NEGATIVE

## 2022-12-25 LAB — RPR: RPR Ser Ql: NONREACTIVE

## 2022-12-25 LAB — HIV ANTIBODY (ROUTINE TESTING W REFLEX): HIV 1&2 Ab, 4th Generation: NONREACTIVE

## 2023-02-12 ENCOUNTER — Other Ambulatory Visit: Payer: Self-pay

## 2023-02-12 ENCOUNTER — Emergency Department (HOSPITAL_COMMUNITY): Payer: 59

## 2023-02-12 ENCOUNTER — Observation Stay (HOSPITAL_COMMUNITY)
Admission: EM | Admit: 2023-02-12 | Discharge: 2023-02-14 | Disposition: A | Discharge status: Home / Self Care | Payer: 59 | Attending: General Surgery | Admitting: General Surgery

## 2023-02-12 ENCOUNTER — Encounter (HOSPITAL_COMMUNITY): Payer: Self-pay

## 2023-02-12 DIAGNOSIS — R1011 Right upper quadrant pain: Secondary | ICD-10-CM | POA: Diagnosis present

## 2023-02-12 DIAGNOSIS — K819 Cholecystitis, unspecified: Secondary | ICD-10-CM | POA: Diagnosis present

## 2023-02-12 DIAGNOSIS — K828 Other specified diseases of gallbladder: Secondary | ICD-10-CM | POA: Diagnosis not present

## 2023-02-12 DIAGNOSIS — K810 Acute cholecystitis: Secondary | ICD-10-CM | POA: Diagnosis not present

## 2023-02-12 DIAGNOSIS — R109 Unspecified abdominal pain: Secondary | ICD-10-CM | POA: Diagnosis not present

## 2023-02-12 DIAGNOSIS — K838 Other specified diseases of biliary tract: Secondary | ICD-10-CM | POA: Diagnosis not present

## 2023-02-12 DIAGNOSIS — K8012 Calculus of gallbladder with acute and chronic cholecystitis without obstruction: Principal | ICD-10-CM | POA: Insufficient documentation

## 2023-02-12 DIAGNOSIS — K8020 Calculus of gallbladder without cholecystitis without obstruction: Secondary | ICD-10-CM | POA: Diagnosis not present

## 2023-02-12 HISTORY — DX: Other complications of anesthesia, initial encounter: T88.59XA

## 2023-02-12 LAB — COMPREHENSIVE METABOLIC PANEL
ALT: 11 U/L (ref 0–44)
AST: 17 U/L (ref 15–41)
Albumin: 4.3 g/dL (ref 3.5–5.0)
Alkaline Phosphatase: 48 U/L (ref 38–126)
Anion gap: 9 (ref 5–15)
BUN: 14 mg/dL (ref 6–20)
CO2: 21 mmol/L — ABNORMAL LOW (ref 22–32)
Calcium: 9.5 mg/dL (ref 8.9–10.3)
Chloride: 106 mmol/L (ref 98–111)
Creatinine, Ser: 0.91 mg/dL (ref 0.44–1.00)
GFR, Estimated: >60 mL/min (ref >60)
Glucose, Bld: 117 mg/dL — ABNORMAL HIGH (ref 70–99)
Potassium: 3.2 mmol/L — ABNORMAL LOW (ref 3.5–5.1)
Sodium: 136 mmol/L (ref 135–145)
Total Bilirubin: 0.5 mg/dL (ref <1.2)
Total Protein: 7.2 g/dL (ref 6.5–8.1)

## 2023-02-12 LAB — CBC
HCT: 38.6 % (ref 36.0–46.0)
Hemoglobin: 13.8 g/dL (ref 12.0–15.0)
MCH: 31.3 pg (ref 26.0–34.0)
MCHC: 35.8 g/dL (ref 30.0–36.0)
MCV: 87.5 fL (ref 80.0–100.0)
Platelets: 332 10*3/uL (ref 150–400)
RBC: 4.41 MIL/uL (ref 3.87–5.11)
RDW: 12 % (ref 11.5–15.5)
WBC: 11.1 10*3/uL — ABNORMAL HIGH (ref 4.0–10.5)
nRBC: 0 % (ref 0.0–0.2)

## 2023-02-12 LAB — URINALYSIS, ROUTINE W REFLEX MICROSCOPIC
Bilirubin Urine: NEGATIVE
Glucose, UA: NEGATIVE mg/dL
Hgb urine dipstick: NEGATIVE
Ketones, ur: NEGATIVE mg/dL
Nitrite: NEGATIVE
Protein, ur: NEGATIVE mg/dL
Specific Gravity, Urine: 1.006 (ref 1.005–1.030)
pH: 7 (ref 5.0–8.0)

## 2023-02-12 LAB — HCG, SERUM, QUALITATIVE: Preg, Serum: NEGATIVE

## 2023-02-12 LAB — LIPASE, BLOOD: Lipase: 43 U/L (ref 11–51)

## 2023-02-12 MED ORDER — MELATONIN 3 MG PO TABS: 3.0000 mg | ORAL_TABLET | Freq: Every evening | ORAL | Status: DC | PRN

## 2023-02-12 MED ORDER — HYDROMORPHONE HCL 1 MG/ML IJ SOLN: 0.5000 mg | INTRAMUSCULAR | Status: DC | PRN

## 2023-02-12 MED ORDER — DIPHENHYDRAMINE HCL 25 MG PO CAPS: 25.0000 mg | ORAL_CAPSULE | Freq: Four times a day (QID) | ORAL | Status: DC | PRN

## 2023-02-12 MED ORDER — ACETAMINOPHEN 500 MG PO TABS
1000.0000 mg | ORAL_TABLET | Freq: Four times a day (QID) | ORAL | Status: DC
  Administered 2023-02-12 – 2023-02-14 (×4): 1000 mg via ORAL
  Filled 2023-02-12 (×7): qty 2

## 2023-02-12 MED ORDER — SIMETHICONE 80 MG PO CHEW
40.0000 mg | CHEWABLE_TABLET | Freq: Four times a day (QID) | ORAL | Status: DC | PRN
  Administered 2023-02-13: 40 mg via ORAL
  Filled 2023-02-12: qty 1

## 2023-02-12 MED ORDER — POTASSIUM CHLORIDE CRYS ER 20 MEQ PO TBCR
40.0000 meq | EXTENDED_RELEASE_TABLET | Freq: Once | ORAL | Status: AC
  Administered 2023-02-12: 40 meq via ORAL
  Filled 2023-02-12: qty 2

## 2023-02-12 MED ORDER — ONDANSETRON 4 MG PO TBDP: 4.0000 mg | ORAL_TABLET | Freq: Four times a day (QID) | ORAL | Status: DC | PRN

## 2023-02-12 MED ORDER — METHOCARBAMOL 500 MG PO TABS: 500.0000 mg | ORAL_TABLET | Freq: Three times a day (TID) | ORAL | Status: DC | PRN

## 2023-02-12 MED ORDER — SODIUM CHLORIDE 0.9 % IV SOLN
2.0000 g | Freq: Every day | INTRAVENOUS | Status: DC
  Administered 2023-02-12 – 2023-02-13 (×2): 2 g via INTRAVENOUS
  Filled 2023-02-12 (×3): qty 20

## 2023-02-12 MED ORDER — ENOXAPARIN SODIUM 40 MG/0.4ML IJ SOSY
40.0000 mg | PREFILLED_SYRINGE | Freq: Every day | INTRAMUSCULAR | Status: DC
  Administered 2023-02-12 – 2023-02-13 (×2): 40 mg via SUBCUTANEOUS
  Filled 2023-02-12 (×2): qty 0.4

## 2023-02-12 MED ORDER — METHOCARBAMOL 1000 MG/10ML IJ SOLN: 500.0000 mg | Freq: Three times a day (TID) | INTRAMUSCULAR | Status: DC | PRN

## 2023-02-12 MED ORDER — DIPHENHYDRAMINE HCL 50 MG/ML IJ SOLN: 25.0000 mg | Freq: Four times a day (QID) | INTRAMUSCULAR | Status: DC | PRN

## 2023-02-12 MED ORDER — ONDANSETRON HCL 4 MG/2ML IJ SOLN
4.0000 mg | Freq: Four times a day (QID) | INTRAMUSCULAR | Status: DC | PRN
  Administered 2023-02-13: 4 mg via INTRAVENOUS
  Filled 2023-02-12: qty 2

## 2023-02-12 MED ORDER — OXYCODONE HCL 5 MG PO TABS
5.0000 mg | ORAL_TABLET | ORAL | Status: DC | PRN
  Administered 2023-02-13 (×2): 10 mg via ORAL
  Administered 2023-02-14: 5 mg via ORAL
  Filled 2023-02-12 (×2): qty 2
  Filled 2023-02-12: qty 1
  Filled 2023-02-12: qty 2

## 2023-02-12 MED ORDER — POLYETHYLENE GLYCOL 3350 17 G PO PACK: 17.0000 g | PACK | Freq: Every day | ORAL | Status: DC | PRN

## 2023-02-12 MED ORDER — DOCUSATE SODIUM 100 MG PO CAPS
100.0000 mg | ORAL_CAPSULE | Freq: Two times a day (BID) | ORAL | Status: DC
  Administered 2023-02-12 – 2023-02-14 (×5): 100 mg via ORAL
  Filled 2023-02-12 (×5): qty 1

## 2023-02-12 NOTE — ED Notes (Signed)
ED TO INPATIENT HANDOFF REPORT  ED Nurse Name and Phone #: [phone]  S Name/Age/Gender Linda Monroe 22 y.o. female Room/Bed: 012C/012C  Code Status   Code Status: Full Code  Home/SNF/Other Home Patient oriented to: self, place, time, and situation Is this baseline? Yes   Triage Complete: Triage complete  Chief Complaint Cholecystitis [K81.9]  Triage Note Pt arrived from home via Pov c/o abd pain that has been intermittent since Monday night. This episode began at approx 0300. Pain 9/10. RUQ. Pt denies n/v   Allergies No Known Allergies  Level of Care/Admitting Diagnosis ED Disposition     ED Disposition  Admit   Condition  --   Comment  Hospital Area: [facility] [[id number]]  Level of Care: Med-Surg [16]  May place patient in observation at [facility] or [facility] if equivalent level of care is available:: No  Covid Evaluation: Asymptomatic - no recent exposure (last 10 days) testing not required  Diagnosis: Cholecystitis [130865]  Admitting Physician: CCS, MD [[id number]]  Attending Physician: CCS, MD [[id number]]          B Medical/Surgery History Past Medical History:  Diagnosis Date   Family history of adverse reaction to anesthesia    pt father has had headaches    Headache    Wears contact lenses    Past Surgical History:  Procedure Laterality Date   LACERATION REPAIR Left 05/04/2017   Procedure: REPAIR COMPLEX FACIAL LACERATION;  Surgeon: Osborn Coho, MD;  Location: [facility] OR;  Service: ENT;  Laterality: Left;   ORIF WRIST FRACTURE Right 05/05/2017   Procedure: REPAIR OF RIGHT WRIST FRACTURE;  Surgeon: Myrene Galas, MD;  Location: [facility] OR;  Service: Orthopedics;  Laterality: Right;   SACRO-ILIAC PINNING Left 05/05/2017   Procedure: LEFT SACRO-ILIAC SCREW;  Surgeon: Myrene Galas, MD;  Location: [facility] OR;  Service: Orthopedics;  Laterality: Left;   SACROILIAC JOINT FUSION Left 01/28/2018   Procedure: SACROILIAC JOINT, removal of 7.3  cannulated screw;  Surgeon: Myrene Galas, MD;  Location: [facility] OR;  Service: Orthopedics;  Laterality: Left;   wisdom teeth       A IV Location/Drains/Wounds Patient Lines/Drains/Airways Status     Active Line/Drains/Airways     None            Intake/Output Last 24 hours No intake or output data in the 24 hours ending 02/12/23 1504  Labs/Imaging Results for orders placed or performed during the hospital encounter of 02/12/23 (from the past 48 hours)  Lipase, blood     Status: None   Collection Time: 02/12/23  6:19 AM  Result Value Ref Range   Lipase 43 11 - 51 U/L    Comment: Performed at [facility], [address] [place], [place] [address]  Comprehensive metabolic panel     Status: Abnormal   Collection Time: 02/12/23  6:19 AM  Result Value Ref Range   Sodium 136 135 - 145 mmol/L   Potassium 3.2 (L) 3.5 - 5.1 mmol/L   Chloride 106 98 - 111 mmol/L   CO2 21 (L) 22 - 32 mmol/L   Glucose, Bld 117 (H) 70 - 99 mg/dL    Comment: Glucose reference range applies only to samples taken after fasting for at least 8 hours.   BUN 14 6 - 20 mg/dL   Creatinine, Ser 6.29 0.44 - 1.00 mg/dL   Calcium 9.5 8.9 - 52.8 mg/dL   Total Protein 7.2 6.5 - 8.1 g/dL   Albumin 4.3  3.5 - 5.0 g/dL   AST 17 15 - 41 U/L   ALT 11 0 - 44 U/L   Alkaline Phosphatase 48 38 - 126 U/L   Total Bilirubin 0.5 <1.2 mg/dL   GFR, Estimated >17 >51 mL/min    Comment: (NOTE) Calculated using the CKD-EPI Creatinine Equation (2021)    Anion gap 9 5 - 15    Comment: Performed at [facility], [address], [place], [place] [address]  CBC     Status: Abnormal   Collection Time: 02/12/23  6:19 AM  Result Value Ref Range   WBC 11.1 (H) 4.0 - 10.5 K/uL   RBC 4.41 3.87 - 5.11 MIL/uL   Hemoglobin 13.8 12.0 - 15.0 g/dL   HCT 27.7 82.4 - 23.5 %   MCV 87.5 80.0 - 100.0 fL   MCH 31.3 26.0 - 34.0 pg   MCHC 35.8 30.0 - 36.0 g/dL   RDW 36.1 44.3 - 15.4 %   Platelets 332 150 - 400 K/uL   nRBC 0.0 0.0  - 0.2 %    Comment: Performed at [facility], [address] [place], [place] [address]  hCG, serum, qualitative     Status: None   Collection Time: 02/12/23  6:19 AM  Result Value Ref Range   Preg, Serum NEGATIVE NEGATIVE    Comment:        THE SENSITIVITY OF THIS METHODOLOGY IS >10 mIU/mL. Performed at [facility], [address], [place], [place] [address]   Urinalysis, Routine w reflex microscopic -Urine, Clean Catch     Status: Abnormal   Collection Time: 02/12/23  9:45 AM  Result Value Ref Range   Color, Urine STRAW (A) YELLOW   APPearance HAZY (A) CLEAR   Specific Gravity, Urine 1.006 1.005 - 1.030   pH 7.0 5.0 - 8.0   Glucose, UA NEGATIVE NEGATIVE mg/dL   Hgb urine dipstick NEGATIVE NEGATIVE   Bilirubin Urine NEGATIVE NEGATIVE   Ketones, ur NEGATIVE NEGATIVE mg/dL   Protein, ur NEGATIVE NEGATIVE mg/dL   Nitrite NEGATIVE NEGATIVE   Leukocytes,Ua TRACE (A) NEGATIVE   RBC / HPF 0-5 0 - 5 RBC/hpf   WBC, UA 0-5 0 - 5 WBC/hpf   Bacteria, UA MANY (A) NONE SEEN   Squamous Epithelial / HPF 0-5 0 - 5 /HPF    Comment: Performed at [facility], [address] [place], [place] [address]   US Abdomen Limited RUQ (LIVER/GB) Result Date: 02/12/2023 CLINICAL DATA:  Abdominal pain EXAM: ULTRASOUND ABDOMEN LIMITED RIGHT UPPER QUADRANT COMPARISON:  None FINDINGS: Gallbladder: Distended gallbladder. Stone towards the neck. Sludge. Wall thickening up to 5 mm. No adjacent fluid. No Murphy's sign was not assessed by the sonographer Common bile duct: Diameter: 2 mm Liver: No focal lesion identified. Within normal limits in parenchymal echogenicity. Portal vein is patent on color Doppler imaging with normal direction of blood flow towards the liver. Other: None. IMPRESSION: Distended gallbladder with sludge and stones. Wall thickening. Please correlate for clinical presentation of acute cholecystitis and if needed further workup as clinically appropriate such as HIDA scan. No  biliary ductal dilatation. Electronically Signed   By: Karen Kays M.D.   On: 02/12/2023 11:09    Pending Labs Unresulted Labs (From admission, onward)     Start     Ordered   02/19/23 0500  Creatinine, serum  (enoxaparin (LOVENOX)    CrCl >/= 30 ml/min)  Weekly,   R  Comments: while on enoxaparin therapy    02/12/23 1324   02/13/23 0500  CBC  Tomorrow morning,   R        02/12/23 1324   02/13/23 0500  Basic metabolic panel  Tomorrow morning,   R        02/12/23 1324            Vitals/Pain Today's Vitals   02/12/23 0542 02/12/23 0555 02/12/23 0557 02/12/23 1017  BP: 125/80   112/62  Pulse: 77   75  Resp: 13   15  Temp: 98 F (36.7 C)   98 F (36.7 C)  TempSrc: Oral     SpO2: 100%   100%  Weight:   127 lb (57.6 kg)   Height:   5\' 5"  (1.651 m)   PainSc:  9       Isolation Precautions No active isolations  Medications Medications  enoxaparin (LOVENOX) injection 40 mg (has no administration in time range)  cefTRIAXone (ROCEPHIN) 2 g in sodium chloride 0.9 % 100 mL IVPB (has no administration in time range)  acetaminophen (TYLENOL) tablet 1,000 mg (has no administration in time range)  oxyCODONE (Oxy IR/ROXICODONE) immediate release tablet 5-10 mg (has no administration in time range)  HYDROmorphone (DILAUDID) injection 0.5 mg (has no administration in time range)  methocarbamol (ROBAXIN) tablet 500 mg (has no administration in time range)    Or  methocarbamol (ROBAXIN) injection 500 mg (has no administration in time range)  melatonin tablet 3 mg (has no administration in time range)  diphenhydrAMINE (BENADRYL) capsule 25 mg (has no administration in time range)    Or  diphenhydrAMINE (BENADRYL) injection 25 mg (has no administration in time range)  docusate sodium (COLACE) capsule 100 mg (has no administration in time range)  polyethylene glycol (MIRALAX / GLYCOLAX) packet 17 g (has no administration in time range)  ondansetron (ZOFRAN-ODT) disintegrating  tablet 4 mg (has no administration in time range)    Or  ondansetron (ZOFRAN) injection 4 mg (has no administration in time range)  simethicone (MYLICON) chewable tablet 40 mg (has no administration in time range)  potassium chloride SA (KLOR-CON M) CR tablet 40 mEq (40 mEq Oral Given 02/12/23 0951)    Mobility walks     Focused Assessments GI   R Recommendations: See Admitting Provider Note  Report given to:   Additional Notes:

## 2023-02-12 NOTE — Plan of Care (Signed)
  Problem: Education: Goal: Knowledge of General Education information will improve Description: Including pain rating scale, medication(s)/side effects and non-pharmacologic comfort measures 02/12/2023 2307 by Tennis Ship, RN Outcome: Progressing 02/12/2023 2307 by Tennis Ship, RN Outcome: Progressing   Problem: Health Behavior/Discharge Planning: Goal: Ability to manage health-related needs will improve 02/12/2023 2307 by Tennis Ship, RN Outcome: Progressing 02/12/2023 2307 by Tennis Ship, RN Outcome: Progressing   Problem: Clinical Measurements: Goal: Ability to maintain clinical measurements within normal limits will improve 02/12/2023 2307 by Tennis Ship, RN Outcome: Progressing 02/12/2023 2307 by Tennis Ship, RN Outcome: Progressing Goal: Will remain free from infection 02/12/2023 2307 by Tennis Ship, RN Outcome: Progressing 02/12/2023 2307 by Tennis Ship, RN Outcome: Progressing Goal: Diagnostic test results will improve 02/12/2023 2307 by Tennis Ship, RN Outcome: Progressing 02/12/2023 2307 by Tennis Ship, RN Outcome: Progressing Goal: Respiratory complications will improve 02/12/2023 2307 by Tennis Ship, RN Outcome: Progressing 02/12/2023 2307 by Tennis Ship, RN Outcome: Progressing Goal: Cardiovascular complication will be avoided 02/12/2023 2307 by Tennis Ship, RN Outcome: Progressing 02/12/2023 2307 by Tennis Ship, RN Outcome: Progressing   Problem: Activity: Goal: Risk for activity intolerance will decrease 02/12/2023 2307 by Tennis Ship, RN Outcome: Progressing 02/12/2023 2307 by Tennis Ship, RN Outcome: Progressing   Problem: Nutrition: Goal: Adequate nutrition will be maintained 02/12/2023 2307 by Tennis Ship, RN Outcome: Progressing 02/12/2023 2307 by Tennis Ship, RN Outcome: Progressing   Problem: Coping: Goal: Level  of anxiety will decrease 02/12/2023 2307 by Tennis Ship, RN Outcome: Progressing 02/12/2023 2307 by Tennis Ship, RN Outcome: Progressing   Problem: Elimination: Goal: Will not experience complications related to bowel motility 02/12/2023 2307 by Tennis Ship, RN Outcome: Progressing 02/12/2023 2307 by Tennis Ship, RN Outcome: Progressing Goal: Will not experience complications related to urinary retention 02/12/2023 2307 by Tennis Ship, RN Outcome: Progressing 02/12/2023 2307 by Tennis Ship, RN Outcome: Progressing   Problem: Pain Management: Goal: General experience of comfort will improve 02/12/2023 2307 by Tennis Ship, RN Outcome: Progressing 02/12/2023 2307 by Tennis Ship, RN Outcome: Progressing   Problem: Safety: Goal: Ability to remain free from injury will improve 02/12/2023 2307 by Tennis Ship, RN Outcome: Progressing 02/12/2023 2307 by Tennis Ship, RN Outcome: Progressing   Problem: Skin Integrity: Goal: Risk for impaired skin integrity will decrease 02/12/2023 2307 by Tennis Ship, RN Outcome: Progressing 02/12/2023 2307 by Tennis Ship, RN Outcome: Progressing

## 2023-02-12 NOTE — H&P (Signed)
Linda Monroe August 02, 2000  [id number].    Requesting MD: Dr. Fulton Reek Chief Complaint/Reason for Consult: Possible cholecystitis  HPI: Linda Monroe is a 22 y.o. female who presented to the ED for abdominal pain.  Patient reports she woke up in significant pain last night in epigastrium and RUQ with radiation to back. She ate pizza for dinner. She had a similar episode last Monday after eating wings when she woke up in the middle of the night with pain. She denies any associated fever, chest pain, shortness of breath, nausea, vomiting, urinary symptoms, vaginal discharge, constipation or diarrhea.  Her symptoms typically last for 3 hours before they resolve.   In the ED patient has been afebrile without tachycardia or hypotension.  WBC 11.1.  Lipase and LFTs within normal limits.  UA negative for UTI.  Pregnancy test negative.  RUQ [place] with distended gallbladder with sludge and stones including stone towards the neck of the gallbladder and wall thickness up to 5 mm; No adjacent pericholecystic fluid; CBD 2 mm.  Since presentation the patient has received no pain medication and pain has improved and is now mild.  We were asked to see for possible cholecystitis.   Past Medical History: HA Prior Abdominal Surgeries: None Blood Thinners: None Last PO intake: yesterday prior to MN Last Colonoscopy: Never Allergies: NKDA Tobacco Use: None Alcohol Use: Occassional  Substance use: None   ROS: As above, see hpi   History reviewed. No pertinent family history.  Past Medical History:  Diagnosis Date   Family history of adverse reaction to anesthesia    pt father has had headaches    Headache    Wears contact lenses     Past Surgical History:  Procedure Laterality Date   LACERATION REPAIR Left 05/04/2017   Procedure: REPAIR COMPLEX FACIAL LACERATION;  Surgeon: Osborn Coho, MD;  Location: [facility] OR;  Service: ENT;  Laterality: Left;   ORIF WRIST FRACTURE Right 05/05/2017    Procedure: REPAIR OF RIGHT WRIST FRACTURE;  Surgeon: Myrene Galas, MD;  Location: [facility] OR;  Service: Orthopedics;  Laterality: Right;   SACRO-ILIAC PINNING Left 05/05/2017   Procedure: LEFT SACRO-ILIAC SCREW;  Surgeon: Myrene Galas, MD;  Location: [facility] OR;  Service: Orthopedics;  Laterality: Left;   SACROILIAC JOINT FUSION Left 01/28/2018   Procedure: SACROILIAC JOINT, removal of 7.3 cannulated screw;  Surgeon: Myrene Galas, MD;  Location: [facility] OR;  Service: Orthopedics;  Laterality: Left;   wisdom teeth      Social History:  reports that she has never smoked. She has never used smokeless tobacco. She reports current alcohol use. She reports that she does not use drugs.  Allergies: No Known Allergies  (Not in a hospital admission)    Physical Exam: Blood pressure 112/62, pulse 75, temperature 98 F (36.7 C), resp. rate 15, height 5\' 5"  (1.651 m), weight 57.6 kg, last menstrual period 01/29/2023, SpO2 100%. General: pleasant, WD/WN female who is laying in bed in NAD HEENT: head is normocephalic, atraumatic.  Sclera are non-icteric. Ears and nose without any obvious masses or lesions.  Mouth is pink and moist. Dentition fair Heart: regular, rate, and rhythm.   Lungs: CTAB, no wheezes, rhonchi, or rales noted.  Respiratory effort nonlabored Abd:  Soft, ND, epigastric and RUQ ttp w/ positive murphy's sign, +BS. No masses, hernias, or organomegaly MS: no BUE or BLE edema Skin: warm and dry  Psych: A&Ox4 with an appropriate affect Neuro: normal speech, thought process intact, moves all  extremities, gait not assessed   Results for orders placed or performed during the hospital encounter of 02/12/23 (from the past 48 hours)  Lipase, blood     Status: None   Collection Time: 02/12/23  6:19 AM  Result Value Ref Range   Lipase 43 11 - 51 U/L    Comment: Performed at [facility], [address], [place], [place] [address]  Comprehensive metabolic panel     Status: Abnormal    Collection Time: 02/12/23  6:19 AM  Result Value Ref Range   Sodium 136 135 - 145 mmol/L   Potassium 3.2 (L) 3.5 - 5.1 mmol/L   Chloride 106 98 - 111 mmol/L   CO2 21 (L) 22 - 32 mmol/L   Glucose, Bld 117 (H) 70 - 99 mg/dL    Comment: Glucose reference range applies only to samples taken after fasting for at least 8 hours.   BUN 14 6 - 20 mg/dL   Creatinine, Ser 8.46 0.44 - 1.00 mg/dL   Calcium 9.5 8.9 - 96.2 mg/dL   Total Protein 7.2 6.5 - 8.1 g/dL   Albumin 4.3 3.5 - 5.0 g/dL   AST 17 15 - 41 U/L   ALT 11 0 - 44 U/L   Alkaline Phosphatase 48 38 - 126 U/L   Total Bilirubin 0.5 <1.2 mg/dL   GFR, Estimated >95 >28 mL/min    Comment: (NOTE) Calculated using the CKD-EPI Creatinine Equation (2021)    Anion gap 9 5 - 15    Comment: Performed at [facility], [address] [place], [place] [address]  CBC     Status: Abnormal   Collection Time: 02/12/23  6:19 AM  Result Value Ref Range   WBC 11.1 (H) 4.0 - 10.5 K/uL   RBC 4.41 3.87 - 5.11 MIL/uL   Hemoglobin 13.8 12.0 - 15.0 g/dL   HCT 40.1 02.7 - 25.3 %   MCV 87.5 80.0 - 100.0 fL   MCH 31.3 26.0 - 34.0 pg   MCHC 35.8 30.0 - 36.0 g/dL   RDW 66.4 40.3 - 47.4 %   Platelets 332 150 - 400 K/uL   nRBC 0.0 0.0 - 0.2 %    Comment: Performed at [facility], [address], [place], [place] [address]  hCG, serum, qualitative     Status: None   Collection Time: 02/12/23  6:19 AM  Result Value Ref Range   Preg, Serum NEGATIVE NEGATIVE    Comment:        THE SENSITIVITY OF THIS METHODOLOGY IS >10 mIU/mL. Performed at [facility], [address] [place], [place] [address]   Urinalysis, Routine w reflex microscopic -Urine, Clean Catch     Status: Abnormal   Collection Time: 02/12/23  9:45 AM  Result Value Ref Range   Color, Urine STRAW (A) YELLOW   APPearance HAZY (A) CLEAR   Specific Gravity, Urine 1.006 1.005 - 1.030   pH 7.0 5.0 - 8.0   Glucose, UA NEGATIVE NEGATIVE mg/dL   Hgb urine dipstick NEGATIVE  NEGATIVE   Bilirubin Urine NEGATIVE NEGATIVE   Ketones, ur NEGATIVE NEGATIVE mg/dL   Protein, ur NEGATIVE NEGATIVE mg/dL   Nitrite NEGATIVE NEGATIVE   Leukocytes,Ua TRACE (A) NEGATIVE   RBC / HPF 0-5 0 - 5 RBC/hpf   WBC, UA 0-5 0 - 5 WBC/hpf   Bacteria, UA MANY (A) NONE SEEN   Squamous Epithelial / HPF 0-5 0 - 5 /HPF  Comment: Performed at [facility], [address] [place], [place] [address]   US Abdomen Limited RUQ (LIVER/GB) Result Date: 02/12/2023 CLINICAL DATA:  Abdominal pain EXAM: ULTRASOUND ABDOMEN LIMITED RIGHT UPPER QUADRANT COMPARISON:  None FINDINGS: Gallbladder: Distended gallbladder. Stone towards the neck. Sludge. Wall thickening up to 5 mm. No adjacent fluid. No Murphy's sign was not assessed by the sonographer Common bile duct: Diameter: 2 mm Liver: No focal lesion identified. Within normal limits in parenchymal echogenicity. Portal vein is patent on color Doppler imaging with normal direction of blood flow towards the liver. Other: None. IMPRESSION: Distended gallbladder with sludge and stones. Wall thickening. Please correlate for clinical presentation of acute cholecystitis and if needed further workup as clinically appropriate such as HIDA scan. No biliary ductal dilatation. Electronically Signed   By: Karen Kays M.D.   On: 02/12/2023 11:09    Anti-infectives (From admission, onward)    None       Assessment/Plan Acute Cholecystitis  Is a 22 year old female presenting with intermittent RUQ abdominal pain.  Her RUQ ultrasound shows a distended gallbladder with sludge, stones and wall thickening of 5 mm.  She does have a stone towards the neck of the gallbladder.  WBC 11.1.  Lipase and LFTs within normal limits.  On exam patient with RUQ ttp.  Suspect patient has early onset acute cholecystitis.  Recommend IV antibiotics and laparoscopic cholecystectomy.  I have explained the procedure, risks, and aftercare of Laparoscopic cholecystectomy with possible IOC.   Risks include but are not limited to anesthesia (MI, CVA, death, prolonged intubation and aspiration), bleeding, infection, wound problems, hernia, bile leak, injury to common bile duct/liver/intestine.  She seems to understand and agrees to proceed.   FEN - CLD, NPO at midnight for the OR.  VTE - SCDs, Lovenox  ID - Rocephin Foley - None Dispo - Admit to observation, OR in the AM  I reviewed ED provider notes, last 24 h vitals and pain scores, last 48 h intake and output, last 24 h labs and trends, and last 24 h imaging results.  Carl Best, [facility] 02/12/2023, 12:33 PM Please see Amion for pager number during day hours 7:00am-4:30pm

## 2023-02-12 NOTE — ED Provider Notes (Signed)
[place] [facility]ERGENCY DE[facility]MENT AT [facility] Provider Note   CSN: [id number] Arrival date & time: 02/12/23  1093     History  Chief Complaint  Patient presents with   Abdominal Pain    HARMONII MUSTOE is a 22 y.o. female.   Abdominal Pain 22 year old female healthy presenting healthy presenting for abdominal pain.  Woke up last night with severe epigastric and right upper quadrant pain.  This happened several hours after dinner.  No nausea, vomiting.  No diarrhea, regular bowels.  No lower abdominal pain.  No urinary symptoms or vaginal bleeding or discharge.  She had an episode like this about a week ago which resolved on its own.     Home Medications Prior to Admission medications   Medication Sig Start Date End Date Taking? Authorizing Provider  acetaminophen (TYLENOL) 500 MG tablet Take 1,000 mg by mouth 2 (two) times daily as needed for moderate pain (pain score 4-6) or headache.   Yes [provider]  Cholecalciferol (VITAMIN D-3 PO) Take 1 capsule by mouth at bedtime.   Yes [provider]  ibuprofen (ADVIL) 200 MG tablet Take 400 mg by mouth 2 (two) times daily as needed for headache or moderate pain (pain score 4-6).   Yes [provider]  Multiple Vitamin (MULTIVITAMIN WITH MINERALS) TABS tablet Take 1 tablet by mouth at bedtime.   Yes [provider]  spironolactone (ALDACTONE) 100 MG tablet Take 100 mg by mouth at bedtime. 12/12/22  Yes [provider]      Allergies    Patient has no known allergies.    Review of Systems   Review of Systems  Gastrointestinal:  Positive for abdominal pain.  Review of systems completed and notable as per HPI.  ROS otherwise negative.   Physical Exam Updated Vital Signs BP 117/75 (BP Location: Right Arm)   Pulse 91   Temp 98.3 F (36.8 C) (Oral)   Resp 16   Ht 5\' 5"  (1.651 m)   Wt 57.6 kg   LMP 01/29/2023 (Exact Date)   SpO2 96%   BMI 21.13 kg/m  Physical Exam Vitals and nursing  note reviewed.  Constitutional:      General: She is not in acute distress.    Appearance: She is well-developed.  HENT:     Head: Normocephalic and atraumatic.  Eyes:     Conjunctiva/sclera: Conjunctivae normal.  Cardiovascular:     Rate and Rhythm: Normal rate and regular rhythm.     Heart sounds: No murmur heard. Pulmonary:     Effort: Pulmonary effort is normal. No respiratory distress.     Breath sounds: Normal breath sounds.  Abdominal:     Palpations: Abdomen is soft.     Tenderness: There is abdominal tenderness in the right upper quadrant and epigastric area. There is no right CVA tenderness, left CVA tenderness, guarding or rebound.  Musculoskeletal:        General: No swelling.     Cervical back: Neck supple.  Skin:    General: Skin is warm and dry.     Capillary Refill: Capillary refill takes less than 2 seconds.  Neurological:     Mental Status: She is alert.  Psychiatric:        Mood and Affect: Mood normal.     ED Results / Procedures / Treatments   Labs (all labs ordered are listed, but only abnormal results are displayed) Labs Reviewed  COMPREHENSIVE METABOLIC PANEL - Abnormal; Notable for the following components:  Result Value   Potassium 3.2 (*)    CO2 21 (*)    Glucose, Bld 117 (*)    All other components within normal limits  CBC - Abnormal; Notable for the following components:   WBC 11.1 (*)    All other components within normal limits  URINALYSIS, ROUTINE W REFLEX MICROSCOPIC - Abnormal; Notable for the following components:   Color, Urine STRAW (*)    APPearance HAZY (*)    Leukocytes,Ua TRACE (*)    Bacteria, UA MANY (*)    All other components within normal limits  LIPASE, BLOOD  HCG, SERUM, QUALITATIVE  CBC  BASIC METABOLIC PANEL    EKG None  Radiology US Abdomen Limited RUQ (LIVER/GB) Result Date: 02/12/2023 CLINICAL DATA:  Abdominal pain EXAM: ULTRASOUND ABDOMEN LIMITED RIGHT UPPER QUADRANT COMPARISON:  None FINDINGS:  Gallbladder: Distended gallbladder. Stone towards the neck. Sludge. Wall thickening up to 5 mm. No adjacent fluid. No Murphy's sign was not assessed by the sonographer Common bile duct: Diameter: 2 mm Liver: No focal lesion identified. Within normal limits in parenchymal echogenicity. Portal vein is patent on color Doppler imaging with normal direction of blood flow towards the liver. Other: None. IMPRESSION: Distended gallbladder with sludge and stones. Wall thickening. Please correlate for clinical presentation of acute cholecystitis and if needed further workup as clinically appropriate such as HIDA scan. No biliary ductal dilatation. Electronically Signed   By: Karen Kays M.D.   On: 02/12/2023 11:09    Procedures Procedures    Medications Ordered in ED Medications  enoxaparin (LOVENOX) injection 40 mg (40 mg Subcutaneous Given 02/12/23 1626)  cefTRIAXone (ROCEPHIN) 2 g in sodium chloride 0.9 % 100 mL IVPB (0 g Intravenous Stopped 02/12/23 1746)  acetaminophen (TYLENOL) tablet 1,000 mg (1,000 mg Oral Not Given 02/12/23 1738)  oxyCODONE (Oxy IR/ROXICODONE) immediate release tablet 5-10 mg (has no administration in time range)  HYDROmorphone (DILAUDID) injection 0.5 mg (has no administration in time range)  methocarbamol (ROBAXIN) tablet 500 mg (has no administration in time range)    Or  methocarbamol (ROBAXIN) injection 500 mg (has no administration in time range)  melatonin tablet 3 mg (has no administration in time range)  diphenhydrAMINE (BENADRYL) capsule 25 mg (has no administration in time range)    Or  diphenhydrAMINE (BENADRYL) injection 25 mg (has no administration in time range)  docusate sodium (COLACE) capsule 100 mg (100 mg Oral Given 02/12/23 1625)  polyethylene glycol (MIRALAX / GLYCOLAX) packet 17 g (has no administration in time range)  ondansetron (ZOFRAN-ODT) disintegrating tablet 4 mg (has no administration in time range)    Or  ondansetron (ZOFRAN) injection 4 mg  (has no administration in time range)  simethicone (MYLICON) chewable tablet 40 mg (has no administration in time range)  potassium chloride SA (KLOR-CON M) CR tablet 40 mEq (40 mEq Oral Given 02/12/23 0951)    ED Course/ Medical Decision Making/ A&P Clinical Course as of 02/12/23 2005  Thu Feb 12, 2023  1252 General surgery to see [JD]    Clinical Course User Index [JD] Laurence Spates, MD                                 Medical Decision Making Amount and/or Complexity of Data Reviewed Labs: ordered. Radiology: ordered.  Risk Prescription drug management. Decision regarding hospitalization.   Medical Decision Making:   ANNAM HENNIGER is a 22 y.o. female who presented  to the ED today with 1 day of epigastric and right upper quadrant abdominal pain.  Mildly tender on exam.  Hemodynamically stable.  Pain is significantly improved, however given location of pain and tenderness obtain ultrasound evaluate for bili pathology.  Lab noted for mild leukocytosis, mild hypokalemia.  Ultrasound was concerning for possible early acute cholecystitis with wall thickening and stones.  General surgery consulted and evaluated, patient admitted to general surgery for further management.   Patient placed on continuous vitals and telemetry monitoring while in ED which was reviewed periodically.  Reviewed and confirmed nursing documentation for past medical history, family history, social history.  Patient's presentation is most consistent with acute complicated illness / injury requiring diagnostic workup.           Final Clinical Impression(s) / ED Diagnoses Final diagnoses:  Acute cholecystitis    Rx / DC Orders ED Discharge Orders     None         Laurence Spates, MD 02/12/23 2005

## 2023-02-12 NOTE — ED Triage Notes (Signed)
Pt arrived from home via Pov c/o abd pain that has been intermittent since Monday night. This episode began at approx 0300. Pain 9/10. RUQ. Pt denies n/v

## 2023-02-13 ENCOUNTER — Encounter (HOSPITAL_COMMUNITY): Payer: Self-pay

## 2023-02-13 ENCOUNTER — Other Ambulatory Visit: Payer: Self-pay

## 2023-02-13 ENCOUNTER — Encounter (HOSPITAL_COMMUNITY)
Admission: EM | Disposition: A | Discharge status: Home / Self Care | Payer: Self-pay | Source: Home / Self Care | Attending: Emergency Medicine

## 2023-02-13 ENCOUNTER — Observation Stay (HOSPITAL_COMMUNITY): Payer: 59 | Admitting: Anesthesiology

## 2023-02-13 ENCOUNTER — Observation Stay (HOSPITAL_BASED_OUTPATIENT_CLINIC_OR_DEPARTMENT_OTHER): Payer: 59 | Admitting: Anesthesiology

## 2023-02-13 DIAGNOSIS — K819 Cholecystitis, unspecified: Secondary | ICD-10-CM | POA: Diagnosis not present

## 2023-02-13 DIAGNOSIS — K8010 Calculus of gallbladder with chronic cholecystitis without obstruction: Secondary | ICD-10-CM | POA: Diagnosis not present

## 2023-02-13 DIAGNOSIS — K810 Acute cholecystitis: Secondary | ICD-10-CM

## 2023-02-13 HISTORY — PX: CHOLECYSTECTOMY: SHX55

## 2023-02-13 LAB — CBC
HCT: 37.9 % (ref 36.0–46.0)
Hemoglobin: 13.6 g/dL (ref 12.0–15.0)
MCH: 31.3 pg (ref 26.0–34.0)
MCHC: 35.9 g/dL (ref 30.0–36.0)
MCV: 87.3 fL (ref 80.0–100.0)
Platelets: 330 10*3/uL (ref 150–400)
RBC: 4.34 MIL/uL (ref 3.87–5.11)
RDW: 12.3 % (ref 11.5–15.5)
WBC: 5.2 10*3/uL (ref 4.0–10.5)
nRBC: 0 % (ref 0.0–0.2)

## 2023-02-13 LAB — BASIC METABOLIC PANEL
Anion gap: 13 (ref 5–15)
BUN: 8 mg/dL (ref 6–20)
CO2: 19 mmol/L — ABNORMAL LOW (ref 22–32)
Calcium: 9.4 mg/dL (ref 8.9–10.3)
Chloride: 107 mmol/L (ref 98–111)
Creatinine, Ser: 0.86 mg/dL (ref 0.44–1.00)
GFR, Estimated: >60 mL/min (ref >60)
Glucose, Bld: 89 mg/dL (ref 70–99)
Potassium: 3.7 mmol/L (ref 3.5–5.1)
Sodium: 139 mmol/L (ref 135–145)

## 2023-02-13 SURGERY — LAPAROSCOPIC CHOLECYSTECTOMY
Anesthesia: General | Site: Abdomen

## 2023-02-13 MED ORDER — INDOCYANINE GREEN 25 MG IV SOLR
2.5000 mg | Freq: Once | INTRAVENOUS | Status: AC
Start: 2023-02-13 — End: 2023-02-13
  Administered 2023-02-13: 2.5 mg via INTRAVENOUS
  Filled 2023-02-13: qty 10

## 2023-02-13 MED ORDER — LIDOCAINE 2% (20 MG/ML) 5 ML SYRINGE
INTRAMUSCULAR | Status: DC | PRN
  Administered 2023-02-13: 40 mg via INTRAVENOUS

## 2023-02-13 MED ORDER — ROCURONIUM BROMIDE 10 MG/ML (PF) SYRINGE
PREFILLED_SYRINGE | INTRAVENOUS | Status: DC | PRN
  Administered 2023-02-13: 50 mg via INTRAVENOUS

## 2023-02-13 MED ORDER — SUGAMMADEX SODIUM 200 MG/2ML IV SOLN
INTRAVENOUS | Status: DC | PRN
  Administered 2023-02-13: 200 mg via INTRAVENOUS

## 2023-02-13 MED ORDER — SCOPOLAMINE 1 MG/3DAYS TD PT72: 1.0000 | MEDICATED_PATCH | TRANSDERMAL | Status: DC

## 2023-02-13 MED ORDER — PHENYLEPHRINE 80 MCG/ML (10ML) SYRINGE FOR IV PUSH (FOR BLOOD PRESSURE SUPPORT)
PREFILLED_SYRINGE | INTRAVENOUS | Status: AC
  Filled 2023-02-13: qty 10

## 2023-02-13 MED ORDER — FENTANYL CITRATE (PF) 100 MCG/2ML IJ SOLN
INTRAMUSCULAR | Status: AC
  Filled 2023-02-13: qty 2

## 2023-02-13 MED ORDER — OXYCODONE HCL 5 MG/5ML PO SOLN: 5.0000 mg | Freq: Once | ORAL | Status: DC | PRN

## 2023-02-13 MED ORDER — SODIUM CHLORIDE 0.9 % IR SOLN
Status: DC | PRN
  Administered 2023-02-13: 1000 mL

## 2023-02-13 MED ORDER — METOPROLOL TARTRATE 5 MG/5ML IV SOLN
INTRAVENOUS | Status: DC | PRN
  Administered 2023-02-13 (×2): 1 mg via INTRAVENOUS

## 2023-02-13 MED ORDER — MIDAZOLAM HCL 2 MG/2ML IJ SOLN
INTRAMUSCULAR | Status: AC
  Filled 2023-02-13: qty 2

## 2023-02-13 MED ORDER — ACETAMINOPHEN 160 MG/5ML PO SOLN
325.0000 mg | ORAL | Status: DC | PRN
Start: 2023-02-13 — End: 2023-02-13

## 2023-02-13 MED ORDER — CHLORHEXIDINE GLUCONATE 0.12 % MT SOLN
OROMUCOSAL | Status: AC
  Administered 2023-02-13: 15 mL via OROMUCOSAL
  Filled 2023-02-13: qty 15

## 2023-02-13 MED ORDER — 0.9 % SODIUM CHLORIDE (POUR BTL) OPTIME
TOPICAL | Status: DC | PRN
  Administered 2023-02-13: 1000 mL

## 2023-02-13 MED ORDER — ONDANSETRON HCL 4 MG/2ML IJ SOLN
INTRAMUSCULAR | Status: AC
  Filled 2023-02-13: qty 4

## 2023-02-13 MED ORDER — ORAL CARE MOUTH RINSE: 15.0000 mL | Freq: Once | OROMUCOSAL | Status: AC

## 2023-02-13 MED ORDER — ACETAMINOPHEN 325 MG PO TABS: 325.0000 mg | ORAL_TABLET | ORAL | Status: DC | PRN

## 2023-02-13 MED ORDER — ONDANSETRON HCL 4 MG/2ML IJ SOLN
INTRAMUSCULAR | Status: DC | PRN
  Administered 2023-02-13: 4 mg via INTRAVENOUS

## 2023-02-13 MED ORDER — LIDOCAINE 2% (20 MG/ML) 5 ML SYRINGE
INTRAMUSCULAR | Status: AC
Start: 2023-02-13 — End: ?
  Filled 2023-02-13: qty 10

## 2023-02-13 MED ORDER — BUPIVACAINE-EPINEPHRINE 0.25% -1:200000 IJ SOLN
INTRAMUSCULAR | Status: DC | PRN
  Administered 2023-02-13: 30 mL

## 2023-02-13 MED ORDER — MIDAZOLAM HCL 2 MG/2ML IJ SOLN
INTRAMUSCULAR | Status: DC | PRN
  Administered 2023-02-13: 1 mg via INTRAVENOUS

## 2023-02-13 MED ORDER — SCOPOLAMINE 1 MG/3DAYS TD PT72
1.0000 | MEDICATED_PATCH | TRANSDERMAL | Status: DC
  Administered 2023-02-13: 1.5 mg via TRANSDERMAL

## 2023-02-13 MED ORDER — METOPROLOL TARTRATE 5 MG/5ML IV SOLN
INTRAVENOUS | Status: AC
  Filled 2023-02-13: qty 5

## 2023-02-13 MED ORDER — ACETAMINOPHEN 10 MG/ML IV SOLN
INTRAVENOUS | Status: AC
  Filled 2023-02-13: qty 100

## 2023-02-13 MED ORDER — CHLORHEXIDINE GLUCONATE 0.12 % MT SOLN: 15.0000 mL | Freq: Once | OROMUCOSAL | Status: AC

## 2023-02-13 MED ORDER — SODIUM CHLORIDE 0.9 % IV SOLN: INTRAVENOUS | Status: DC

## 2023-02-13 MED ORDER — PROPOFOL 10 MG/ML IV BOLUS
INTRAVENOUS | Status: DC | PRN
  Administered 2023-02-13: 140 mg via INTRAVENOUS
  Administered 2023-02-13: 30 mg via INTRAVENOUS

## 2023-02-13 MED ORDER — SCOPOLAMINE 1 MG/3DAYS TD PT72
MEDICATED_PATCH | TRANSDERMAL | Status: AC
  Filled 2023-02-13: qty 1

## 2023-02-13 MED ORDER — PHENYLEPHRINE 80 MCG/ML (10ML) SYRINGE FOR IV PUSH (FOR BLOOD PRESSURE SUPPORT)
PREFILLED_SYRINGE | INTRAVENOUS | Status: DC | PRN
  Administered 2023-02-13: 80 ug via INTRAVENOUS

## 2023-02-13 MED ORDER — KETOROLAC TROMETHAMINE 30 MG/ML IJ SOLN
INTRAMUSCULAR | Status: DC | PRN
  Administered 2023-02-13: 15 mg via INTRAVENOUS

## 2023-02-13 MED ORDER — FENTANYL CITRATE (PF) 250 MCG/5ML IJ SOLN
INTRAMUSCULAR | Status: DC | PRN
  Administered 2023-02-13 (×2): 100 ug via INTRAVENOUS
  Administered 2023-02-13: 50 ug via INTRAVENOUS

## 2023-02-13 MED ORDER — FENTANYL CITRATE (PF) 100 MCG/2ML IJ SOLN: 25.0000 ug | INTRAMUSCULAR | Status: DC | PRN

## 2023-02-13 MED ORDER — ROCURONIUM BROMIDE 10 MG/ML (PF) SYRINGE
PREFILLED_SYRINGE | INTRAVENOUS | Status: AC
  Filled 2023-02-13: qty 20

## 2023-02-13 MED ORDER — FENTANYL CITRATE (PF) 250 MCG/5ML IJ SOLN
INTRAMUSCULAR | Status: AC
  Filled 2023-02-13: qty 5

## 2023-02-13 MED ORDER — ACETAMINOPHEN 10 MG/ML IV SOLN
1000.0000 mg | Freq: Once | INTRAVENOUS | Status: DC | PRN
  Administered 2023-02-13: 1000 mg via INTRAVENOUS

## 2023-02-13 MED ORDER — DROPERIDOL 2.5 MG/ML IJ SOLN: 0.6250 mg | Freq: Once | INTRAMUSCULAR | Status: DC | PRN

## 2023-02-13 MED ORDER — OXYCODONE HCL 5 MG PO TABS: 5.0000 mg | ORAL_TABLET | Freq: Once | ORAL | Status: DC | PRN

## 2023-02-13 SURGICAL SUPPLY — 36 items
BAG COUNTER SPONGE SURGICOUNT (BAG) ×1 IMPLANT
BLADE CLIPPER SURG (BLADE) IMPLANT
CANISTER SUCT 3000ML PPV (MISCELLANEOUS) ×1 IMPLANT
CHLORAPREP W/TINT 26 (MISCELLANEOUS) ×1 IMPLANT
CLIP LIGATING HEMO O LOK GREEN (MISCELLANEOUS) ×1 IMPLANT
COVER SURGICAL LIGHT HANDLE (MISCELLANEOUS) ×1 IMPLANT
DERMABOND ADVANCED .7 DNX12 (GAUZE/BANDAGES/DRESSINGS) ×1 IMPLANT
DERMABOND ADVANCED .7 DNX6 (GAUZE/BANDAGES/DRESSINGS) IMPLANT
ELECT REM PT RETURN 9FT ADLT (ELECTROSURGICAL) ×1
ELECTRODE REM PT RTRN 9FT ADLT (ELECTROSURGICAL) ×1 IMPLANT
GLOVE BIOGEL PI IND STRL 7.0 (GLOVE) ×1 IMPLANT
GLOVE SURG SS PI 7.0 STRL IVOR (GLOVE) ×1 IMPLANT
GOWN STRL REUS W/ TWL LRG LVL3 (GOWN DISPOSABLE) ×3 IMPLANT
GRASPER SUT TROCAR 14GX15 (MISCELLANEOUS) ×1 IMPLANT
IRRIG SUCT STRYKERFLOW 2 WTIP (MISCELLANEOUS) ×1
IRRIGATION SUCT STRKRFLW 2 WTP (MISCELLANEOUS) ×1 IMPLANT
KIT BASIN OR (CUSTOM PROCEDURE TRAY) ×1 IMPLANT
KIT IMAGING PINPOINTPAQ (MISCELLANEOUS) IMPLANT
KIT TURNOVER KIT B (KITS) ×1 IMPLANT
NDL 22X1.5 STRL (OR ONLY) (MISCELLANEOUS) ×1 IMPLANT
NEEDLE 22X1.5 STRL (OR ONLY) (MISCELLANEOUS) ×1 IMPLANT
NS IRRIG 1000ML POUR BTL (IV SOLUTION) ×1 IMPLANT
PAD ARMBOARD 7.5X6 YLW CONV (MISCELLANEOUS) ×1 IMPLANT
POUCH RETRIEVAL ECOSAC 10 (ENDOMECHANICALS) ×1 IMPLANT
SCISSORS LAP 5X35 DISP (ENDOMECHANICALS) ×1 IMPLANT
SET TUBE SMOKE EVAC HIGH FLOW (TUBING) ×1 IMPLANT
SLEEVE Z-THREAD 5X100MM (TROCAR) ×2 IMPLANT
SPECIMEN JAR SMALL (MISCELLANEOUS) ×1 IMPLANT
SUT MNCRL AB 4-0 PS2 18 (SUTURE) ×1 IMPLANT
TOWEL GREEN STERILE (TOWEL DISPOSABLE) ×1 IMPLANT
TOWEL GREEN STERILE FF (TOWEL DISPOSABLE) ×1 IMPLANT
TRAY LAPAROSCOPIC MC (CUSTOM PROCEDURE TRAY) ×1 IMPLANT
TROCAR Z THREAD OPTICAL 12X100 (TROCAR) ×1 IMPLANT
TROCAR Z-THREAD OPTICAL 5X100M (TROCAR) ×1 IMPLANT
WARMER LAPAROSCOPE (MISCELLANEOUS) ×1 IMPLANT
WATER STERILE IRR 1000ML POUR (IV SOLUTION) ×1 IMPLANT

## 2023-02-13 NOTE — Progress Notes (Signed)
Transition of Care [facility]) - Inpatient Brief Assessment   Patient Details  Name: Linda Monroe MRN: [id number] Date of Birth: 10-Sep-2000  Transition of Care [facility] CM/SW Contact:    Janae Bridgeman, RN Phone Number: 02/13/2023, 3:40 PM   Clinical Narrative: Patient is S/P Lap Cholecystectomy.  No TOC needs at this time.  Patient should return home when medically stable for discharge.   Transition of Care Asessment: Insurance and Status: (P) Insurance coverage has been reviewed Patient has primary care physician: (P) Yes Home environment has been reviewed: (P) from home with family Prior level of function:: (P) Independent Prior/Current Home Services: (P) No current home services Social Drivers of Health Review: (P) SDOH reviewed interventions complete Readmission risk has been reviewed: (P) Yes Transition of care needs: (P) no transition of care needs at this time

## 2023-02-13 NOTE — Anesthesia Procedure Notes (Signed)
Procedure Name: Intubation Date/Time: 02/13/2023 12:49 PM  Performed by: Ayesha Rumpf, CRNAPre-anesthesia Checklist: Patient identified, Emergency Drugs available, Suction available and Patient being monitored Patient Re-evaluated:Patient Re-evaluated prior to induction Oxygen Delivery Method: Circle System Utilized Preoxygenation: Pre-oxygenation with 100% oxygen Induction Type: IV induction Ventilation: Mask ventilation without difficulty Laryngoscope Size: Mac and 3 Grade View: Grade I Tube type: Oral Tube size: 7.0 mm Number of attempts: 1 Airway Equipment and Method: Stylet and Oral airway Placement Confirmation: ETT inserted through vocal cords under direct vision, positive ETCO2 and breath sounds checked- equal and bilateral Secured at: 21 cm Tube secured with: Tape Dental Injury: Teeth and Oropharynx as per pre-operative assessment

## 2023-02-13 NOTE — Plan of Care (Signed)

## 2023-02-13 NOTE — Progress Notes (Signed)
Pre Procedure note for inpatients:   Linda Monroe has been scheduled for Procedure(s): LAPAROSCOPIC CHOLECYSTECTOMY (N/A) today. The various methods of treatment have been discussed with the patient. After consideration of the risks, benefits and treatment options the patient has consented to the planned procedure.   The patient has been seen and labs reviewed. There are no changes in the patient's condition to prevent proceeding with the planned procedure today.  Recent labs:  Lab Results  Component Value Date   WBC 5.2 02/13/2023   HGB 13.6 02/13/2023   HCT 37.9 02/13/2023   PLT 330 02/13/2023   GLUCOSE 89 02/13/2023   CHOL 140 12/24/2022   TRIG 123 12/24/2022   HDL 38 (L) 12/24/2022   LDLCALC 80 12/24/2022   ALT 11 02/12/2023   AST 17 02/12/2023   NA 139 02/13/2023   K 3.7 02/13/2023   CL 107 02/13/2023   CREATININE 0.86 02/13/2023   BUN 8 02/13/2023   CO2 19 (L) 02/13/2023   INR 1.41 05/04/2017    Rodman Pickle, MD 02/13/2023 12:03 PM

## 2023-02-13 NOTE — Op Note (Signed)
PATIENT:  Linda Monroe  22 y.o. female  PRE-OPERATIVE DIAGNOSIS:  Cholecystitis  POST-OPERATIVE DIAGNOSIS:  Cholecystitis  PROCEDURE:  Procedure(s): LAPAROSCOPIC CHOLECYSTECTOMY   SURGEON:  Lance Galas, De Bl[place], MD   ASSISTANT: none  ANESTHESIA:   local and general  Indications for procedure: [name] is a 22 y.o. female with symptoms of Abdominal pain consistent with gallbladder disease, Confirmed by ultrasound.  Description of procedure: The patient was brought into the operative suite, placed supine. Anesthesia was administered with endotracheal tube. Patient was strapped in place and foot board was secured. All pressure points were offloaded by foam padding. The patient was prepped and draped in the usual sterile fashion.  A periumbilical incision was made and optical entry was used to enter the abdomen. 2 5 mm trocars were placed on in the right lateral space on in the right subcostal space. A 12mm trocar was placed in the subxiphoid space. Marcaine was infused to the subxiphoid space and lateral upper right abdomen in the transversus abdominis plane. Next the patient was placed in reverse trendelenberg. The gallbladder appearedacutely inflamed.   The gallbladder was retracted cephalad and lateral. The peritoneum was reflected off the infundibulum working lateral to medial. The cystic duct and cystic artery were identified and further dissection revealed a critical view. The cystic duct and cystic artery were doubly clipped and ligated.   The gallbladder was removed off the liver bed with cautery. The Gallbladder was placed in a specimen bag. The gallbladder fossa was irrigated and hemostasis was applied with cautery. The gallbladder was removed via the 12mm trocar. The fascial defect was closed with interrupted 0 vicryl suture via laparoscopic trans-fascial suture passer. Pneumoperitoneum was removed, all trocar were removed. All incisions were closed with 4-0 monocryl  subcuticular stitch. The patient woke from anesthesia and was brought to PACU in stable condition. All counts were correct  Findings: acute cholecystitis  Specimen: gallbladder  Blood loss: 20 ml  Local anesthesia: 30 ml Marcaine  Complications: none  PLAN OF CARE: Admit to inpatient   PATIENT DISPOSITION:  PACU - hemodynamically stable.  Images:   De Blanch [facility], [place]

## 2023-02-13 NOTE — Anesthesia Preprocedure Evaluation (Signed)
Anesthesia Evaluation  Patient identified by MRN, date of birth, ID band Patient awake    Reviewed: Allergy & Precautions, NPO status , Patient's Chart, lab work & pertinent test results  Airway Mallampati: I  TM Distance: >3 FB Neck ROM: Full    Dental  (+) Teeth Intact, Dental Advisory Given   Pulmonary neg pulmonary ROS   breath sounds clear to auscultation       Cardiovascular negative cardio ROS  Rhythm:Regular Rate:Normal     Neuro/Psych  Headaches    GI/Hepatic negative GI ROS, Neg liver ROS,,,  Endo/Other  negative endocrine ROS    Renal/GU Renal disease     Musculoskeletal negative musculoskeletal ROS (+)    Abdominal   Peds  Hematology negative hematology ROS (+)   Anesthesia Other Findings   Reproductive/Obstetrics                             Anesthesia Physical Anesthesia Plan  ASA: 2  Anesthesia Plan: General   Post-op Pain Management: Tylenol PO (pre-op)* and Toradol IV (intra-op)*   Induction: Intravenous  PONV Risk Score and Plan: 4 or greater and Ondansetron, Dexamethasone, Midazolam and Scopolamine patch - Pre-op  Airway Management Planned: Oral ETT  Additional Equipment: None  Intra-op Plan:   Post-operative Plan: Extubation in OR  Informed Consent: I have reviewed the patients History and Physical, chart, labs and discussed the procedure including the risks, benefits and alternatives for the proposed anesthesia with the patient or authorized representative who has indicated his/her understanding and acceptance.     Dental advisory given  Plan Discussed with: CRNA  Anesthesia Plan Comments:        Anesthesia Quick Evaluation

## 2023-02-13 NOTE — Progress Notes (Signed)
CCC Pre-op Review  Pre-op checklist: To be completed by bedside RN    NPO: Ordered  Labs: Preg Neg  Consent: Ordered  H&P: done by PA on 12/19  Vitals: WNL  O2 requirements: RA  MAR/PTA review: Last dose of Lovenox 12/19 1626  IV: 20g  Floor nurse name:  Redgie Grayer  Additional info:

## 2023-02-13 NOTE — Anesthesia Postprocedure Evaluation (Addendum)
Anesthesia Post Note  Patient: Linda Monroe  Procedure(s) Performed: LAPAROSCOPIC CHOLECYSTECTOMY (Abdomen)     Patient location during evaluation: PACU Anesthesia Type: General Level of consciousness: awake and alert Pain management: pain level controlled Vital Signs Assessment: post-procedure vital signs reviewed and stable Respiratory status: spontaneous breathing, nonlabored ventilation, respiratory function stable and patient connected to nasal cannula oxygen Cardiovascular status: blood pressure returned to baseline and stable Postop Assessment: no apparent nausea or vomiting Anesthetic complications: no  No notable events documented.  Last Vitals:  Vitals:   02/13/23 1400 02/13/23 1415  BP: (!) 120/96 113/84  Pulse: 78 81  Resp: 16 16  Temp:  36.5 C  SpO2: 97% 94%                Shelton Silvas

## 2023-02-13 NOTE — Transfer of Care (Signed)
Immediate Anesthesia Transfer of Care Note  Patient: Linda Monroe  Procedure(s) Performed: LAPAROSCOPIC CHOLECYSTECTOMY (Abdomen)  Patient Location: PACU  Anesthesia Type:General  Level of Consciousness: awake, drowsy, patient cooperative, and responds to stimulation  Airway & Oxygen Therapy: Patient Spontanous Breathing  Post-op Assessment: Report given to RN and Post -op Vital signs reviewed and stable  Post vital signs: Reviewed and stable  Last Vitals:  Vitals Value Taken Time  BP 120/80 02/13/23 1346  Temp    Pulse 93 02/13/23 1347  Resp 17 02/13/23 1347  SpO2 100 % 02/13/23 1347  Vitals shown include unfiled device data.  Last Pain:  Vitals:   02/13/23 1122  TempSrc:   PainSc: 0-No pain         Complications: No notable events documented.

## 2023-02-13 NOTE — Discharge Instructions (Signed)

## 2023-02-14 ENCOUNTER — Encounter (HOSPITAL_COMMUNITY): Payer: Self-pay | Admitting: General Surgery

## 2023-02-14 MED ORDER — DOCUSATE SODIUM 100 MG PO CAPS: 100.0000 mg | ORAL_CAPSULE | Freq: Two times a day (BID) | ORAL | Status: AC | PRN

## 2023-02-14 MED ORDER — POLYETHYLENE GLYCOL 3350 17 G PO PACK: 17.0000 g | PACK | Freq: Every day | ORAL | Status: AC | PRN

## 2023-02-14 MED ORDER — OXYCODONE HCL 5 MG PO TABS: 5.0000 mg | ORAL_TABLET | Freq: Four times a day (QID) | ORAL | 0 refills | Status: AC | PRN

## 2023-02-14 NOTE — Progress Notes (Signed)
Patient tolerated diet well. Patient also ambulated in room and hallway without any issues.

## 2023-02-14 NOTE — Progress Notes (Signed)
Patient verbalized understanding of dc instructions. Work note and [employer], belongings with patient upon dc.

## 2023-02-14 NOTE — Plan of Care (Signed)
  Problem: Education: Goal: Knowledge of General Education information will improve Description: Including pain rating scale, medication(s)/side effects and non-pharmacologic comfort measures Outcome: Progressing   Problem: Health Behavior/Discharge Planning: Goal: Ability to manage health-related needs will improve Outcome: Progressing   Problem: Clinical Measurements: Goal: Ability to maintain clinical measurements within normal limits will improve Outcome: Progressing Goal: Will remain free from infection Outcome: Progressing Goal: Diagnostic test results will improve Outcome: Progressing Goal: Respiratory complications will improve Outcome: Progressing Goal: Cardiovascular complication will be avoided Outcome: Progressing   Problem: Activity: Goal: Risk for activity intolerance will decrease Outcome: Progressing   Problem: Nutrition: Goal: Adequate nutrition will be maintained Outcome: Progressing   Problem: Coping: Goal: Level of anxiety will decrease Outcome: Progressing   Problem: Elimination: Goal: Will not experience complications related to bowel motility Outcome: Progressing Goal: Will not experience complications related to urinary retention Outcome: Progressing   Problem: Pain Management: Goal: General experience of comfort will improve Outcome: Progressing

## 2023-02-14 NOTE — Discharge Summary (Signed)
  [facility] Discharge Summary   Patient ID: CYNTHIE RAMSELL MRN: [id number] DOB/AGE: 2000/08/11 22 y.o.  Admit date: 02/12/2023 Discharge date: 02/14/2023  Admitting Diagnosis: Cholecystitis [K81.9]   Discharge Diagnosis cholecystitis  Consultants none Imaging: No results found.  Procedures Dr. Sheliah Hatch (02/13/23) - Laparoscopic Cholecystectomy  Hospital Course:  22 y.o. female who presented to ED with abdominal pain.  Workup showed cholecystitis.  Patient was admitted and underwent procedure listed above.  Tolerated procedure well and was transferred to the floor.  Diet was advanced as tolerated.  On POD1, the patient was voiding well, tolerating diet, ambulating well, pain well controlled, vital signs stable, incisions c/d/i and felt stable for discharge home.  Patient will follow up in our office in 3 weeks and knows to call with questions or concerns.    Physical Exam: General:  Alert, NAD, pleasant, comfortable Abd:  Soft, ND, mild tenderness, incisions C/D/I  I or a member of my team have reviewed this patient in the Controlled Substance Database.  Allergies as of 02/14/2023   No Known Allergies      Medication List     STOP taking these medications    multivitamin with minerals Tabs tablet   VITAMIN D-3 PO       TAKE these medications    acetaminophen 500 MG tablet Commonly known as: TYLENOL Take 1,000 mg by mouth 2 (two) times daily as needed for moderate pain (pain score 4-6) or headache.   docusate sodium 100 MG capsule Commonly known as: COLACE Take 1 capsule (100 mg total) by mouth 2 (two) times daily as needed for mild constipation.   ibuprofen 200 MG tablet Commonly known as: ADVIL Take 400 mg by mouth 2 (two) times daily as needed for headache or moderate pain (pain score 4-6).   oxyCODONE 5 MG immediate release tablet Commonly known as: Oxy IR/ROXICODONE Take 1 tablet (5 mg total) by mouth every 6 (six) hours as needed  for moderate pain (pain score 4-6) or severe pain (pain score 7-10).   polyethylene glycol 17 g packet Commonly known as: MIRALAX / GLYCOLAX Take 17 g by mouth daily as needed for severe constipation or moderate constipation.   spironolactone 100 MG tablet Commonly known as: ALDACTONE Take 100 mg by mouth at bedtime.          Follow-up Information     Kinsinger, De Blanch, MD. Go on 03/05/2023.   Specialty: General Surgery Why: 3PM, bring a copy of your photo ID and insurance card, arrive 30 minutes prior to your appointment Contact information: [address] [place] [address] [phone]                 Signed: Clarise Cruz [facility] 02/14/2023, 11:28 AM Please see Amion for pager number during day hours 7:00am-4:30pm

## 2023-02-17 LAB — SURGICAL PATHOLOGY

## 2023-11-15 ENCOUNTER — Other Ambulatory Visit: Payer: Self-pay | Admitting: Medical Genetics

## 2024-02-08 ENCOUNTER — Other Ambulatory Visit (HOSPITAL_COMMUNITY)
Admission: RE | Admit: 2024-02-08 | Discharge: 2024-02-08 | Disposition: A | Payer: Self-pay | Source: Ambulatory Visit | Attending: Medical Genetics | Admitting: Medical Genetics

## 2024-02-19 LAB — GENECONNECT MOLECULAR SCREEN: Genetic Analysis Overall Interpretation: NEGATIVE
